# Patient Record
Sex: Female | Born: 1990 | Race: White | Hispanic: No | Marital: Married | Smoking: Never smoker
Health system: Southern US, Community
[De-identification: ages and names within clinical notes are randomized; demographics above are authoritative.]

## PROBLEM LIST (undated history)

## (undated) DIAGNOSIS — E119 Type 2 diabetes mellitus without complications: Secondary | ICD-10-CM

## (undated) DIAGNOSIS — Q512 Other doubling of uterus, unspecified: Secondary | ICD-10-CM

## (undated) DIAGNOSIS — N83209 Unspecified ovarian cyst, unspecified side: Secondary | ICD-10-CM

## (undated) DIAGNOSIS — Q5128 Other doubling of uterus, other specified: Secondary | ICD-10-CM

## (undated) DIAGNOSIS — F4310 Post-traumatic stress disorder, unspecified: Secondary | ICD-10-CM

## (undated) HISTORY — PX: NO PAST SURGERIES: SHX2092

## (undated) HISTORY — DX: Post-traumatic stress disorder, unspecified: F43.10

## (undated) HISTORY — PX: FINGER FRACTURE SURGERY: SHX638

## (undated) HISTORY — DX: Other and unspecified doubling of uterus: Q51.28

## (undated) HISTORY — DX: Other doubling of uterus, unspecified: Q51.20

## (undated) HISTORY — DX: Unspecified ovarian cyst, unspecified side: N83.209

---
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
## 2011-02-26 DIAGNOSIS — E109 Type 1 diabetes mellitus without complications: Secondary | ICD-10-CM | POA: Insufficient documentation

## 2014-03-03 ENCOUNTER — Emergency Department: Payer: Self-pay | Admitting: Emergency Medicine

## 2014-04-04 ENCOUNTER — Ambulatory Visit: Payer: Self-pay | Admitting: Internal Medicine

## 2016-10-25 ENCOUNTER — Ambulatory Visit
Admission: EM | Admit: 2016-10-25 | Discharge: 2016-10-25 | Disposition: A | Discharge status: Home / Self Care | Payer: Federal, State, Local not specified - PPO | Attending: Family Medicine | Admitting: Family Medicine

## 2016-10-25 DIAGNOSIS — Z711 Person with feared health complaint in whom no diagnosis is made: Secondary | ICD-10-CM

## 2016-10-25 HISTORY — DX: Type 2 diabetes mellitus without complications: E11.9

## 2016-10-25 NOTE — ED Triage Notes (Addendum)
Patient was having lab work done yesterday and the tech drawing blood didn't change gloves between patients. [facility] sent her to see a MD yesterday however she would like a second opinion. That MD didn't see any significance in being worried.

## 2016-10-25 NOTE — ED Provider Notes (Signed)
CSN: [id number]     Arrival date & time 10/25/16  1602 History   First MD Initiated Contact with Patient 10/25/16 1619     Chief Complaint  Patient presents with  . Body Fluid Exposure   (Consider location/radiation/quality/duration/timing/severity/associated sxs/prior Treatment) HPI   26 year old Caucasian female with history of insulin-dependent diabetes mellitus that presents with concerns of possible blood borne pathogen transmission. The patient reports that she underwent routine blood work done at a laboratory on 10/24/16. The phlebotomist was moving some specimens with his gloved hands and then proceeded to draw her blood with the same gloves on. The patient is very concerned that she may have been exposed to blood borne pathogens. She denies any dirty needle sticks, cuts or skin abrasions.   The patient was evaluated within the [facility] shortly after this incident for the same. She reports that she was told that there was no indicated testing as the risk of blood-borne pathogen transmission was very low. The patient presents today for a second evaluation. She denies any specific symptoms at this time.  Past Medical History:  Diagnosis Date  . Diabetes mellitus without complication ([place])    History reviewed. No pertinent surgical history. History reviewed. No pertinent family history. Social History  Substance Use Topics  . Smoking status: Never Smoker  . Smokeless tobacco: Never Used  . Alcohol use No   OB History    No data available     Review of Systems  Constitutional: Negative.   HENT: Negative.   Eyes: Negative.   Respiratory: Negative.   Cardiovascular: Negative.   Gastrointestinal: Negative.   Genitourinary: Negative.   Skin: Negative.   Neurological: Negative.     Allergies  Zoloft [sertraline hcl]  Home Medications   Prior to Admission medications   Medication Sig Start Date End Date Taking? Authorizing Provider  insulin aspart (NOVOLOG) 100  UNIT/ML injection Inject into the skin 3 (three) times daily before meals.   Yes Historical Provider, MD   Meds Ordered and Administered this Visit  Medications - No data to display  BP 121/72 (BP Location: Left Arm)   Pulse 99   Temp 98.9 F (37.2 C) (Oral)   Resp 18   Ht 5\' 10"  (1.778 m)   Wt 145 lb (65.8 kg)   SpO2 99%   BMI 20.81 kg/m  No data found.   Physical Exam  Constitutional: She is oriented to person, place, and time. She appears well-developed and well-nourished. No distress.  Cardiovascular: Normal rate, regular rhythm and normal heart sounds.   Pulmonary/Chest: Effort normal and breath sounds normal.  Neurological: She is alert and oriented to person, place, and time.  Skin: Skin is warm and dry.  Psychiatric: She has a normal mood and affect.    Urgent Care Course     Procedures (including critical care time)  Labs Review Labs Reviewed - No data to display  Imaging Review No results found.   Visual Acuity Review  Right Eye Distance:   Left Eye Distance:   Bilateral Distance:    Right Eye Near:   Left Eye Near:    Bilateral Near:         MDM   1. Feared complaint without diagnosis    No indicated testing as the risk of blood-borne pathogen transmission is very low. All questions have been answered and all concerns have been addressed. The patient verbalized understanding and had no further questions     Enrique Sack, FNP 10/25/16  1645  

## 2016-11-29 ENCOUNTER — Ambulatory Visit
Admission: EM | Admit: 2016-11-29 | Discharge: 2016-11-29 | Disposition: A | Discharge status: Home / Self Care | Payer: Federal, State, Local not specified - PPO | Attending: Emergency Medicine | Admitting: Emergency Medicine

## 2016-11-29 DIAGNOSIS — R42 Dizziness and giddiness: Secondary | ICD-10-CM

## 2016-11-29 DIAGNOSIS — I499 Cardiac arrhythmia, unspecified: Secondary | ICD-10-CM | POA: Insufficient documentation

## 2016-11-29 DIAGNOSIS — F418 Other specified anxiety disorders: Secondary | ICD-10-CM | POA: Diagnosis not present

## 2016-11-29 MED ORDER — MECLIZINE HCL 25 MG PO TABS: ORAL_TABLET | ORAL | 0 refills | Status: DC

## 2016-11-29 NOTE — ED Provider Notes (Addendum)
CSN: [id number]     Arrival date & time 11/29/16  1253 History   First MD Initiated Contact with Patient 11/29/16 1401     Chief Complaint  Patient presents with  . Dizziness  . Irregular Heart Beat   (Consider location/radiation/quality/duration/timing/severity/associated sxs/prior Treatment) HPI  This a 26 year old female who presents with dizziness and feelings of an irregular heart rhythm. At work yesterday she said she was unable to stand momentarily. She feels at times that the room will be spinning around her. This happened only on 2 occasions. His had no syncope or near-syncope. She has had no vomiting but has had some associated nausea. She denies any weakness numbness or tingling. 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 She evidently had a drop of  blood hit her exposed skin. There was no penetration. She was seen at the employee health clinic and was told that it is such low risk that no further laboratory or actions needed to be taken. Despite that she is still very worried regarding the exposure and has been ruminating since that time. She states that she has been maintaining her fluids. She is a type I diabetic on insulin pump and states that her sugars have been running 123 this morning.      Past Medical History:  Diagnosis Date  . Diabetes mellitus without complication ([place])    History reviewed. No pertinent surgical history. Family History  Problem Relation Age of Onset  . Diabetes Mother   . Healthy Father    Social History  Substance Use Topics  . Smoking status: Never Smoker  . Smokeless tobacco: Never Used  . Alcohol use No   OB History    No data available     Review of Systems  Constitutional: Positive for activity change. Negative for chills, fatigue and fever.  Cardiovascular: Positive for palpitations.  Negative for chest pain.  Neurological: Positive for dizziness. Negative for syncope, speech difficulty, weakness and numbness.  Psychiatric/Behavioral: Positive for dysphoric mood. The patient is nervous/anxious.   All other systems reviewed and are negative.   Allergies  Zoloft [sertraline hcl]  Home Medications   Prior to Admission medications   Medication Sig Start Date End Date Taking? Authorizing Provider  cetirizine (ZYRTEC) 10 MG tablet Take 10 mg by mouth daily.   Yes [provider]  insulin aspart (NOVOLOG) 100 UNIT/ML injection Inject into the skin 3 (three) times daily before meals.    [provider]  meclizine (ANTIVERT) 25 MG tablet 1/2-1 tablet 3  times daily as necessary for dizziness 11/29/16   Lorin Picket, PA-C   Meds Ordered and Administered this Visit  Medications - No data to display  BP 113/65 (BP Location: Left Arm)   Pulse 75   Temp 98.5 F (36.9 C) (Oral)   Resp 18   Ht 5\' 10"  (1.778 m)   Wt 145 lb (65.8 kg)   LMP 11/26/2016   SpO2 100%   BMI 20.81 kg/m  Orthostatic VS for the past 24 hrs:  BP- Lying Pulse- Lying BP- Sitting Pulse- Sitting BP- Standing at 0 minutes Pulse- Standing at 0 minutes  11/29/16 1338 101/56 70 111/72 79 110/71 92    Physical Exam  Constitutional: She is oriented to person, place, and time. She appears well-developed and well-nourished. No distress.  HENT:  Head: Normocephalic and  atraumatic.  Right Ear: External ear normal.  Left Ear: External ear normal.  Nose: Nose normal.  Mouth/Throat: Oropharynx is clear and moist. No oropharyngeal exudate.  Eyes: EOM are normal. Pupils are equal, round, and reactive to light. Right eye exhibits no discharge. Left eye exhibits no discharge.  Neck: Normal range of motion. Neck supple.  Cardiovascular: Normal rate, regular rhythm and normal heart sounds.  Exam reveals no gallop and no friction rub.   No murmur heard. Musculoskeletal: Normal range of motion.   Lymphadenopathy:    She has no cervical adenopathy.  Neurological: She is alert and oriented to person, place, and time. She displays normal reflexes. No cranial nerve deficit or sensory deficit. She exhibits normal muscle tone. Coordination normal.  Skin: Skin is warm and dry. She is not diaphoretic.  Psychiatric: Her behavior is normal. Judgment and thought content normal.  The patient appears very distant and depressed. She has a rather flat affect.  Nursing note and vitals reviewed.   Urgent Care Course     Procedures (including critical care time)  Labs Review Labs Reviewed - No data to display  Imaging Review No results found.   Visual Acuity Review  Right Eye Distance:   Left Eye Distance:   Bilateral Distance:    Right Eye Near:   Left Eye Near:    Bilateral Near:     ED ECG REPORT   Date: 11/29/2016  EKG Time: 5:52 PM  Rate:71 Rhythm: normal sinus rhythm and sinus arrhythmia, there are no previous tracings available for comparison  Axis: normal  Intervals:none  ST&T Change: None  Narrative Interpretation: Normal sinus rhythm at 71 bpm with sinus arrhythmia. No acute changes seen              MDM   1. Dizziness   2. Anxiety about health    Discharge Medication List as of 11/29/2016  2:29 PM    START taking these medications   Details  meclizine (ANTIVERT) 25 MG tablet 1/2-1 tablet 3  times daily as necessary for dizziness, Normal      Plan: 1. Test/x-ray results and diagnosis reviewed with patient 2. rx as per orders; risks, benefits, potential side effects reviewed with patient 3. Recommend supportive treatment with maintaining intake of fluid and strict glycemic control. Patient's major problem today appears to be her anxiety over the blood exposure that she had in the past. She still ruminating about it and this caused a great deal of anxiety and apparent depression. I have highly recommended that she follow-up with her primary care physician  Dr. Hoy Morn to discuss this further. Another option would be to return to employee health, describe her concerns to see if anything else may be done to alleviate her fears. 4. F/u prn if symptoms worsen or don't improve     Lorin Picket, PA-C 11/29/16 1459    Lorin Picket, PA-C 11/29/16 1752

## 2016-11-29 NOTE — ED Triage Notes (Signed)
Pt c/o being dizzy and feeling like her heartbeat is irregular. Yesterday at work she says she couldn't stand, she is staying hydrated with fluids.

## 2017-02-08 ENCOUNTER — Encounter: Payer: Self-pay | Admitting: Obstetrics and Gynecology

## 2017-02-08 ENCOUNTER — Ambulatory Visit (INDEPENDENT_AMBULATORY_CARE_PROVIDER_SITE_OTHER): Payer: Federal, State, Local not specified - PPO | Admitting: Obstetrics and Gynecology

## 2017-02-08 VITALS — BP 112/66 | HR 86 | Ht 70.0 in | Wt 145.8 lb

## 2017-02-08 DIAGNOSIS — Z9641 Presence of insulin pump (external) (internal): Secondary | ICD-10-CM | POA: Insufficient documentation

## 2017-02-08 DIAGNOSIS — N946 Dysmenorrhea, unspecified: Secondary | ICD-10-CM

## 2017-02-08 DIAGNOSIS — Q5128 Other doubling of uterus, other specified: Secondary | ICD-10-CM

## 2017-02-08 DIAGNOSIS — Z794 Long term (current) use of insulin: Secondary | ICD-10-CM

## 2017-02-08 DIAGNOSIS — Q512 Other doubling of uterus, unspecified: Secondary | ICD-10-CM

## 2017-02-08 DIAGNOSIS — N83202 Unspecified ovarian cyst, left side: Secondary | ICD-10-CM

## 2017-02-08 DIAGNOSIS — E119 Type 2 diabetes mellitus without complications: Secondary | ICD-10-CM

## 2017-02-08 DIAGNOSIS — F419 Anxiety disorder, unspecified: Secondary | ICD-10-CM | POA: Insufficient documentation

## 2017-02-08 DIAGNOSIS — N949 Unspecified condition associated with female genital organs and menstrual cycle: Secondary | ICD-10-CM | POA: Insufficient documentation

## 2017-02-08 DIAGNOSIS — IMO0001 Reserved for inherently not codable concepts without codable children: Secondary | ICD-10-CM

## 2017-02-08 NOTE — Progress Notes (Signed)
Chief complaint: 1. Left lower quadrant pelvic pain 2. History of left ovarian cyst 3. History of septate uterus  Patient is a 26 year old P0, not currently using anything for contraception, not currently sexually active, presents for evaluation of pelvic pain, left lower quadrant, and history of left ovarian cyst.  Gynecologic history: Menarche: Age 45 Age 41-18-intervals where irregular with approximately 3 cycles per year; menses were heavy and painful Age [age] (2010) amenorrhea developed until 2015 when cycles became spontaneous again; evaluation during this timeframe revealed a potential septate uterus and a 6.1 cm left ovarian cyst which remained stable on serial ultrasounds. Last ultrasound was performed in 2012. Age 78 - present-regular spontaneous cycles; interval 30-40 days; duration of flow 4 days  Patient does report significant dysmenorrhea with bilateral pelvic cramping as well as low back ache with radiation into the buttocks and thighs bilaterally. She has never been diagnosed with endometriosis. She has never had a pelvic exam.  Past Medical History:  Diagnosis Date  . Diabetes mellitus without complication ([place])   . Ovarian cyst   . Septate uterus    Past Surgical History:  Procedure Laterality Date  . NO PAST SURGERIES     Family history: Negative for cancer of the colon ovary or breast; diabetes mellitus is notable in mother and brother; heart disease is notable in maternal grandmother  Social history: Nonsmoker; nondrinker; non-substance use  Review of Systems  Constitutional: Negative.   HENT: Negative.   Respiratory: Negative.   Cardiovascular: Negative.   Gastrointestinal: Negative.   Genitourinary: Negative.        Dysmenorrhea  Musculoskeletal: Negative.   Skin: Negative.   Neurological: Negative.   Endo/Heme/Allergies:       Insulin-dependent diabetes mellitus  Psychiatric/Behavioral: Negative.    OBJECTIVE: BP 112/66   Pulse 86   Ht 5\' 10"   (1.778 m)   Wt 145 lb 12.8 oz (66.1 kg)   LMP 02/07/2017 (Exact Date)   BMI 20.92 kg/m  Pleasant well-appearing female in no acute distress. Alert and oriented. Back: No CVA tenderness Abdomen: Soft, nontender without organomegaly Pelvic exam: External genitalia-Normal BUS-normal Vagina-normal estrogen effect; bloody mucus/menses in vault Cervix-bloody mucus at os: Nulliparous; no cervical motion tenderness Uterus-small, mobile, retroverted, nontender Adnexa-nonpalpable and nontender; no obvious fullness or mass in the left adnexa Rectovaginal-normal external exam Extremities: Warm and dry Skin: No rash  ASSESSMENT: 1. History of left ovarian cyst, 6 cm; no clinical abnormality today 2. History of dysmenorrhea 3. History of menorrhagia 4. History of septate uterus  PLAN: 1. Pelvic ultrasound 2. Return in several weeks for follow-up on ultrasound and further management planning 3. Patient needs Pap smear and consideration for contraception 4. Continue Midol for dysmenorrhea  Brayton Mars, MD  Note: This dictation was prepared with Dragon dictation along with smaller phrase technology. Any transcriptional errors that result from this process are unintentional.

## 2017-02-08 NOTE — Patient Instructions (Signed)
1. Pelvic ultrasound is scheduled 2. Return in 1 week after ultrasound (first available appointment) for follow-up 3. Continue taking Midol for dysmenorrhea

## 2017-02-17 ENCOUNTER — Ambulatory Visit
Admission: EM | Admit: 2017-02-17 | Discharge: 2017-02-17 | Disposition: A | Discharge status: Home / Self Care | Payer: Federal, State, Local not specified - PPO | Attending: Registered Nurse | Admitting: Registered Nurse

## 2017-02-17 ENCOUNTER — Encounter: Payer: Self-pay | Admitting: Gynecology

## 2017-02-17 DIAGNOSIS — M545 Low back pain: Secondary | ICD-10-CM

## 2017-02-17 DIAGNOSIS — N390 Urinary tract infection, site not specified: Secondary | ICD-10-CM

## 2017-02-17 LAB — URINALYSIS, COMPLETE (UACMP) WITH MICROSCOPIC
BILIRUBIN URINE: NEGATIVE
Bacteria, UA: NONE SEEN
GLUCOSE, UA: NEGATIVE mg/dL
HGB URINE DIPSTICK: NEGATIVE
Ketones, ur: NEGATIVE mg/dL
Leukocytes, UA: NEGATIVE
NITRITE: NEGATIVE
PH: 7.5 (ref 5.0–8.0)
Protein, ur: NEGATIVE mg/dL
RBC / HPF: NONE SEEN RBC/hpf (ref 0–5)
SPECIFIC GRAVITY, URINE: 1.015 (ref 1.005–1.030)
WBC, UA: NONE SEEN WBC/hpf (ref 0–5)

## 2017-02-17 MED ORDER — SULFAMETHOXAZOLE-TRIMETHOPRIM 800-160 MG PO TABS: 1.0000 | ORAL_TABLET | Freq: Two times a day (BID) | ORAL | 0 refills | Status: DC

## 2017-02-17 NOTE — ED Triage Notes (Signed)
Per patient frequent / burning and lower pelvic pain with urination x 2 weeks.

## 2017-02-17 NOTE — ED Provider Notes (Signed)
CSN: [id number]     Arrival date & time 02/17/17  1223 History   First MD Initiated Contact with Patient 02/17/17 1257     Chief Complaint  Patient presents with  . Urinary Tract Infection   (Consider location/radiation/quality/duration/timing/severity/associated sxs/prior Treatment) 26y/o caucasian female diabetic blood sugar 127 yesterday "normal.  Has been having dysuria (urgency and pressure suprapubic) x 2 weeks denied hematuria, headache, fever, nausea, vomiting, personal or family history kidney stones or antibiotic resistence.  + low back pain bilaterally.  Last UTI March 2018 resolved with antibiotics (she cannot remember what she took)       Past Medical History:  Diagnosis Date  . Diabetes mellitus without complication ([place])   . Ovarian cyst   . Septate uterus    Past Surgical History:  Procedure Laterality Date  . NO PAST SURGERIES     Family History  Problem Relation Age of Onset  . Diabetes Mother   . Healthy Father   . Heart disease Maternal Grandmother   . Breast cancer Neg Hx   . Ovarian cancer Neg Hx   . Colon cancer Neg Hx    Social History  Substance Use Topics  . Smoking status: Never Smoker  . Smokeless tobacco: Never Used  . Alcohol use No   OB History    Gravida Para Term Preterm AB Living   0 0 0 0 0 0   SAB TAB Ectopic Multiple Live Births   0 0 0 0 0     Review of Systems  Constitutional: Negative for activity change, appetite change, chills, diaphoresis, fatigue and fever.  HENT: Negative for trouble swallowing and voice change.   Eyes: Negative for pain and discharge.  Respiratory: Negative for cough and wheezing.   Cardiovascular: Negative for chest pain and leg swelling.  Gastrointestinal: Negative for abdominal distention, abdominal pain, blood in stool, constipation, diarrhea, nausea and vomiting.  Endocrine: Negative for cold intolerance and heat intolerance.  Genitourinary: Positive for dysuria, frequency and urgency. Negative  for decreased urine volume, difficulty urinating, enuresis, flank pain, genital sores, hematuria, menstrual problem, pelvic pain, vaginal bleeding, vaginal discharge and vaginal pain.  Musculoskeletal: Positive for back pain. Negative for arthralgias, gait problem, joint swelling, myalgias, neck pain and neck stiffness.  Skin: Negative for color change and rash.  Allergic/Immunologic: Negative for environmental allergies and food allergies.  Neurological: Negative for dizziness, tremors, syncope, weakness and headaches.  Hematological: Negative for adenopathy. Does not bruise/bleed easily.  Psychiatric/Behavioral: Negative for agitation, confusion and sleep disturbance. The patient is not nervous/anxious.     Allergies  Zoloft [sertraline hcl]  Home Medications   Prior to Admission medications   Medication Sig Start Date End Date Taking? Authorizing Provider  cetirizine (ZYRTEC) 10 MG tablet Take 10 mg by mouth daily.   Yes [provider]  insulin aspart (NOVOLOG) 100 UNIT/ML injection Inject into the skin 3 (three) times daily before meals.   Yes [provider]  sulfamethoxazole-trimethoprim (BACTRIM DS,SEPTRA DS) 800-160 MG tablet Take 1 tablet by mouth 2 (two) times daily. 02/17/17   Betancourt, Aura Fey, NP   Meds Ordered and Administered this Visit  Medications - No data to display  BP 107/68 (BP Location: Left Arm)   Pulse 75   Temp 99 F (37.2 C) (Oral)   Resp 16   Wt 150 lb (68 kg)   LMP 02/07/2017 (Exact Date)   SpO2 100%   BMI 21.52 kg/m  No data found.   Physical Exam  Constitutional: She is oriented to person, place, and time. Vital signs are normal. She appears well-developed and well-nourished. She is active and cooperative.  Non-toxic appearance. She does not have a sickly appearance. She does not appear ill. No distress.  HENT:  Head: Normocephalic and atraumatic.  Right Ear: Hearing and external ear normal.  Left Ear: Hearing and external ear  normal.  Nose: Nose normal.  Mouth/Throat: Uvula is midline, oropharynx is clear and moist and mucous membranes are normal. No oropharyngeal exudate, posterior oropharyngeal edema or posterior oropharyngeal erythema. No tonsillar exudate.  Eyes: Pupils are equal, round, and reactive to light. Conjunctivae, EOM and lids are normal. Right eye exhibits no discharge. Left eye exhibits no discharge. No scleral icterus.  Neck: Trachea normal, normal range of motion and phonation normal. Neck supple. No spinous process tenderness and no muscular tenderness present. No neck rigidity. No tracheal deviation, no edema, no erythema and normal range of motion present. No thyromegaly present.  Cardiovascular: Normal rate, regular rhythm, normal heart sounds and intact distal pulses.  Exam reveals no gallop and no friction rub.   No murmur heard. Pulses:      Radial pulses are 2+ on the right side, and 2+ on the left side.  Pulmonary/Chest: Effort normal and breath sounds normal. No stridor. No respiratory distress. She has no wheezes.  Abdominal: Soft. Normal appearance and bowel sounds are normal. She exhibits no shifting dullness, no distension, no pulsatile liver, no fluid wave, no abdominal bruit, no ascites, no pulsatile midline mass and no mass. There is no hepatosplenomegaly. There is tenderness in the suprapubic area. There is no rigidity, no rebound, no guarding, no CVA tenderness, no tenderness at McBurney's point and negative Murphy's sign. No hernia. Hernia confirmed negative in the ventral area.    Pressure with palpation suprapubic; normoactive bowel sounds; dull to percussion x 4 quads  Musculoskeletal: Normal range of motion. She exhibits no edema, tenderness or deformity.       Right shoulder: Normal.       Left shoulder: Normal.       Right elbow: Normal.      Left elbow: Normal.       Right hip: Normal.       Left hip: Normal.       Right knee: Normal.       Left knee: Normal.       Right  ankle: Normal.       Left ankle: Normal.       Cervical back: Normal.       Thoracic back: Normal.       Lumbar back: She exhibits pain. She exhibits normal range of motion, no tenderness, no bony tenderness, no swelling, no edema, no deformity, no laceration, no spasm and normal pulse.       Back:       Right hand: Normal.       Left hand: Normal.  Lymphadenopathy:       Head (right side): No submental, no submandibular, no tonsillar, no preauricular, no posterior auricular and no occipital adenopathy present.       Head (left side): No submental, no submandibular, no tonsillar, no preauricular, no posterior auricular and no occipital adenopathy present.    She has no cervical adenopathy.       Right cervical: No superficial cervical, no deep cervical and no posterior cervical adenopathy present.      Left cervical: No superficial cervical, no deep cervical and no posterior cervical adenopathy  present.  Neurological: She is alert and oriented to person, place, and time. She has normal strength. She is not disoriented. She displays no atrophy and no tremor. No cranial nerve deficit or sensory deficit. She exhibits normal muscle tone. She displays no seizure activity. Coordination and gait normal. GCS eye subscore is 4. GCS verbal subscore is 5. GCS motor subscore is 6.  Normal/heel toe gait  Skin: Skin is warm, dry and intact. Capillary refill takes less than 2 seconds. No bruising and no rash noted. She is not diaphoretic. No cyanosis or erythema. No pallor. Nails show no clubbing.  Psychiatric: She has a normal mood and affect. Her speech is normal and behavior is normal. Judgment and thought content normal. She is not actively hallucinating. Cognition and memory are normal. She is attentive.  Nursing note and vitals reviewed.   Urgent Care Course     Procedures (including critical care time)  Labs Review Labs Reviewed  URINALYSIS, COMPLETE (UACMP) WITH MICROSCOPIC - Abnormal; Notable  for the following:       Result Value   Color, Urine STRAW (*)    Squamous Epithelial / LPF 0-5 (*)    All other components within normal limits  URINE CULTURE    Imaging Review No results found.  Patient verbally notified of urinalysis results no glucose, ketones, frank blood, bacteria.  Trace epithelials noted.  Sent for urine culture results typically available in 72 hours will call with results once available.  May leave message at [phone] if no answer per patient.  Patient notified can be with urethritis, kidney stones.  Hydrate keep urine pale yellow.  May use OTC azo prn dysuria.  Given bactrim DS po BID x 7days #14 RF0 in case positive culture.  Patient verbalized understanding information/instructions, agreed with plan of care and had no further questions at this time     MDM   1. Urinary tract infection without hematuria, site unspecified    Possible dilution urine as not first and has been hydrating to make sure she could urinate at urgent care.  PMHx diabetes type I states sugars normal 127 last check yesterday.  No glucosuria today.  Bilateral low back pain.  Discussed signs and symptoms nephrolithiasis.  Given Rx Bactrim DS po BID x 7 days #14 RF0 and urine culture pending/sent. Medications as directed.  Patient is also to push fluids and may use Azo OTC po TID as needed.  Hydrate, avoid dehydration.  Avoid holding urine void on frequent basis every 4 to 6 hours.  If unable to void every 8 hours, tea/cola colored urine, frank hematuria, worst back pain of her life, repetitive vomiting follow up for re-evaluation with PCM, urgent care or ER.   Call or return to clinic as needed if these symptoms worsen or fail to improve as anticipated.  Exitcare handout on cystitis given to patient Patient verbalized agreement and understanding of treatment plan and had no further questions at this time. P2:  Hydrate and cranberry juice     Olen Cordial, NP 02/17/17 [phone]

## 2017-02-18 ENCOUNTER — Ambulatory Visit (INDEPENDENT_AMBULATORY_CARE_PROVIDER_SITE_OTHER): Payer: Federal, State, Local not specified - PPO

## 2017-02-18 DIAGNOSIS — Q512 Other doubling of uterus, unspecified: Secondary | ICD-10-CM

## 2017-02-18 DIAGNOSIS — N83202 Unspecified ovarian cyst, left side: Secondary | ICD-10-CM

## 2017-02-18 DIAGNOSIS — Q5128 Other doubling of uterus, other specified: Secondary | ICD-10-CM

## 2017-02-18 LAB — URINE CULTURE: Special Requests: NORMAL

## 2017-02-21 ENCOUNTER — Telehealth: Payer: Self-pay | Admitting: Registered Nurse

## 2017-02-21 ENCOUNTER — Encounter: Payer: Self-pay | Admitting: Registered Nurse

## 2017-02-21 MED ORDER — AMOXICILLIN-POT CLAVULANATE 875-125 MG PO TABS: 1.0000 | ORAL_TABLET | Freq: Two times a day (BID) | ORAL | 0 refills | Status: AC

## 2017-02-21 NOTE — Telephone Encounter (Signed)
Patient contacted via telephone still symptomatic.  Will Rx augmentin 875mg  po BID x 3 days #6 RF0 for treatment of group B strep UTI.  Patient has been taking bactrim instructed her to stop as no improvement with 48 hours of use.  Discussed could be colonized with group B strep and not an acute infection also.  Hydrate avoid holding urine.  If still symptomatic after augmentin will require repeat urinalysis/re-evaluation.  Patient verbalized understanding information/instructions, agreed with plan of care and had no further questions at this time

## 2017-03-13 ENCOUNTER — Encounter: Payer: Federal, State, Local not specified - PPO | Admitting: Obstetrics and Gynecology

## 2017-03-19 ENCOUNTER — Ambulatory Visit (INDEPENDENT_AMBULATORY_CARE_PROVIDER_SITE_OTHER): Payer: Federal, State, Local not specified - PPO | Admitting: Obstetrics and Gynecology

## 2017-03-19 ENCOUNTER — Encounter: Payer: Self-pay | Admitting: Obstetrics and Gynecology

## 2017-03-19 VITALS — BP 89/56 | HR 56 | Ht 70.0 in | Wt 146.2 lb

## 2017-03-19 DIAGNOSIS — N946 Dysmenorrhea, unspecified: Secondary | ICD-10-CM | POA: Diagnosis not present

## 2017-03-19 DIAGNOSIS — N83202 Unspecified ovarian cyst, left side: Secondary | ICD-10-CM

## 2017-03-19 NOTE — Patient Instructions (Signed)
1. Laparoscopy with peritoneal biopsies and left ovarian cystectomy is to be scheduled. 2. Return the week before surgery for preoperative appointment as scheduled  Diagnostic Laparoscopy A diagnostic laparoscopy is a procedure to diagnose diseases in the abdomen. During the procedure, a thin, lighted, pencil-sized instrument called a laparoscope is inserted into the abdomen through an incision. The laparoscope allows your health care provider to look at the organs inside your body. Tell a health care provider about:  Any allergies you have.  All medicines you are taking, including vitamins, herbs, eye drops, creams, and over-the-counter medicines.  Any problems you or family members have had with anesthetic medicines.  Any blood disorders you have.  Any surgeries you have had.  Any medical conditions you have. What are the risks? Generally, this is a safe procedure. However, problems can occur, which may include:  Infection.  Bleeding.  Damage to other organs.  Allergic reaction to the anesthetics used during the procedure.  What happens before the procedure?  Do not eat or drink anything after midnight on the night before the procedure or as directed by your health care provider.  Ask your health care provider about: ? Changing or stopping your regular medicines. ? Taking medicines such as aspirin and ibuprofen. These medicines can thin your blood. Do not take these medicines before your procedure if your health care provider instructs you not to.  Plan to have someone take you home after the procedure. What happens during the procedure?  You may be given a medicine to help you relax (sedative).  You will be given a medicine to make you sleep (general anesthetic).  Your abdomen will be inflated with a gas. This will make your organs easier to see.  Small incisions will be made in your abdomen.  A laparoscope and other small instruments will be inserted into the  abdomen through the incisions.  A tissue sample may be removed from an organ in the abdomen for examination.  The instruments will be removed from the abdomen.  The gas will be released.  The incisions will be closed with stitches (sutures). What happens after the procedure? Your blood pressure, heart rate, breathing rate, and blood oxygen level will be monitored often until the medicines you were given have worn off. This information is not intended to replace advice given to you by your health care provider. Make sure you discuss any questions you have with your health care provider. Document Released: 10/15/2000 Document Revised: 11/17/2015 Document Reviewed: 02/19/2014 Elsevier Interactive Patient Education  [employer].

## 2017-03-19 NOTE — Progress Notes (Signed)
Chief complaint: 1. Follow-up on ultrasound 2. History of dysmenorrhea 3. History of septate uterus  The patient presents today for a conference to discuss ultrasound findings.  Ultrasound: ULTRASOUND REPORT  Location: [facility] Date of Service: 02/18/17   Indications:Pelvic Pain Findings:  The uterus measures 7.1 x 4.1 x 5.2 cm. Echo texture is homogenous without evidence of focal masses. There is fluid seen within the cervix.  The Endometrium measures 6.3 mm.  Right Ovary measures 3.3 x 1.6 x 2.1 cm. It is normal in appearance. Left Ovary measures 9.5 x 6 x 5.3 cm. There is a large simple cyst measuring 5.7 x 3.8 x 6.2 cm. Blood flow is sen in the left ovary using color doppler. Survey of the adnexa demonstrates no adnexal masses. There is no free fluid in the cul de sac.  Impression: 1. There is a large simple cyst of the left measuring 5.7 x 3.8 x 6.2 cm. 2. Fluid seen in cervix  Recommendations: 1.Clinical correlation with the patient's History and Physical Exam.   Tyrone Apple  Brayton Mars, MD  Findings from ultrasound are reviewed.  OBJECTIVE: BP (!) 89/56   Pulse (!) 56   Ht 5\' 10"  (1.778 m)   Wt 146 lb 3.2 oz (66.3 kg)   LMP 03/08/2017 (Exact Date)   BMI 20.98 kg/m  Physical exam-deferred  ASSESSMENT: 1. Pelvic pain 2. History of dysmenorrhea 3. Left ovarian cyst, persistent, 6.2 cm 4. Insulin dependent diabetes mellitus  PLAN: 1. Recommend laparoscopy with peritoneal biopsies to rule out endometriosis as well as left ovarian cystectomy 2. Consider oral contraceptives or Depo-Provera for management of dysmenorrhea and menorrhagia 3. Return 1 week prior to surgery for preoperative appointment 4. Literature regarding laparoscopy is provided  A total of 15 minutes were spent face-to-face with the patient during this encounter and over half of that time dealt with counseling and coordination of care.  Brayton Mars, MD  Note: This dictation was prepared with Dragon dictation along with smaller phrase technology. Any transcriptional errors that result from this process are unintentional.

## 2017-05-07 ENCOUNTER — Encounter: Payer: Self-pay | Admitting: Obstetrics and Gynecology

## 2017-05-07 ENCOUNTER — Ambulatory Visit (INDEPENDENT_AMBULATORY_CARE_PROVIDER_SITE_OTHER): Payer: Federal, State, Local not specified - PPO | Admitting: Obstetrics and Gynecology

## 2017-05-07 VITALS — BP 103/69 | HR 80 | Ht 70.0 in | Wt 143.2 lb

## 2017-05-07 DIAGNOSIS — Q512 Other doubling of uterus, unspecified: Secondary | ICD-10-CM

## 2017-05-07 DIAGNOSIS — Z01818 Encounter for other preprocedural examination: Secondary | ICD-10-CM

## 2017-05-07 DIAGNOSIS — N946 Dysmenorrhea, unspecified: Secondary | ICD-10-CM

## 2017-05-07 DIAGNOSIS — N83202 Unspecified ovarian cyst, left side: Secondary | ICD-10-CM

## 2017-05-07 DIAGNOSIS — Q5128 Other doubling of uterus, other specified: Secondary | ICD-10-CM

## 2017-05-07 DIAGNOSIS — E119 Type 2 diabetes mellitus without complications: Secondary | ICD-10-CM

## 2017-05-07 DIAGNOSIS — Z794 Long term (current) use of insulin: Secondary | ICD-10-CM

## 2017-05-07 DIAGNOSIS — IMO0001 Reserved for inherently not codable concepts without codable children: Secondary | ICD-10-CM

## 2017-05-07 NOTE — H&P (Signed)
Subjective:  PREOPERATIVE HISTORY AND PHYSICAL   Date of surgery: 05/13/2017 Procedure: Laparoscopy with peritoneal biopsies and left ovarian cystectomy Diagnoses: 1. Pelvic pain 2. Dysmenorrhea 3. Left ovarian cyst   Patient is a 26 y.o. [id number] scheduled for surgery on 05/13/2017.  Patient has history of left lower quadrant pain and left ovarian cyst along with possible septate uterus. Due to the degree of painful symptoms, she desires workup including probable left ovarian cystectomy and peritoneal biopsies for rule out endometriosis.   Gynecologic history: Menarche: Age 89 Age [age]-intervals where irregular with approximately 3 cycles per year; menses were heavy and painful Age [age] (2010) amenorrhea developed until 2015 when cycles became spontaneous again; evaluation during this timeframe revealed a potential septate uterus and a 6.1 cm left ovarian cyst which remained stable on serial ultrasounds. Last ultrasound was performed in 2012. Age 17 - present-regular spontaneous cycles; interval 30-40 days; duration of flow 4 days  Patient does report significant dysmenorrhea with bilateral pelvic cramping as well as low back ache with radiation into the buttocks and thighs bilaterally. She has never been diagnosed with endometriosis. She has never had a pelvic exam.  OB History    Gravida Para Term Preterm AB Living   0 0 0 0 0 0   SAB TAB Ectopic Multiple Live Births   0 0 0 0 0      Patient's last menstrual period was 04/06/2017 (exact date).    Past Medical History:  Diagnosis Date  . Diabetes mellitus without complication ([place])   . Ovarian cyst   . PTSD (post-traumatic stress disorder)   . Septate uterus     Past Surgical History:  Procedure Laterality Date  . NO PAST SURGERIES      OB History  Gravida Para Term Preterm AB Living  0 0 0 0 0 0  SAB TAB Ectopic Multiple Live Births  0 0 0 0 0        Social History   Social History  . Marital  status: Single    Spouse name: N/A  . Number of children: N/A  . Years of education: N/A   Social History Main Topics  . Smoking status: Never Smoker  . Smokeless tobacco: Never Used  . Alcohol use No  . Drug use: No  . Sexual activity: Yes    Birth control/ protection: Condom   Other Topics Concern  . None   Social History Narrative  . None    Family History  Problem Relation Age of Onset  . Diabetes Mother   . Healthy Father   . Heart disease Maternal Grandmother   . Breast cancer Neg Hx   . Ovarian cancer Neg Hx   . Colon cancer Neg Hx      (Not in a hospital admission)  Allergies  Allergen Reactions  . Zoloft [Sertraline Hcl] Itching    Review of Systems Constitutional: No recent fever/chills/sweats Respiratory: No recent cough/bronchitis Cardiovascular: No chest pain Gastrointestinal: No recent nausea/vomiting/diarrhea Genitourinary: No UTI symptoms Hematologic/lymphatic:No history of coagulopathy or recent blood thinner use    Objective:    BP 103/69   Pulse 80   Ht 5\' 10"  (1.778 m)   Wt 143 lb 3.2 oz (65 kg)   LMP 04/06/2017 (Exact Date)   BMI 20.55 kg/m   General:   Normal  Skin:   normal  HEENT:  Normal  Neck:  Supple without Adenopathy or Thyromegaly  Lungs:   Heart:  Breasts:   Abdomen:  Pelvis:  M/S   Extremeties:  Neuro:    clear to auscultation bilaterally   Normal without murmur   Not Examined   soft, non-tender; bowel sounds normal; no masses,  no organomegaly   Exam deferred to OR  No CVAT  Warm/Dry   Normal       02/08/2017 Pelvic exam: External genitalia-Normal BUS-normal Vagina-normal estrogen effect; bloody mucus/menses in vault Cervix-bloody mucus at os: Nulliparous; no cervical motion tenderness Uterus-small, mobile, retroverted, nontender Adnexa-nonpalpable and nontender; no obvious fullness or mass in the left adnexa Rectovaginal-normal external exam  02/18/2017 ULTRASOUND  REPORT  Location: [facility] Date of Service: 02/18/17   Indications:Pelvic Pain Findings: The uterus measures 7.1 x 4.1 x 5.2 cm. Echo texture is homogenouswithoutevidence of focal masses. There is fluid seen within the cervix.  The Endometriummeasures 6.3 mm.  Right Ovary measures 3.3 x 1.6 x 2.1 cm. It is normal in appearance. Left Ovary measures 9.5 x 6 x 5.3 cm. There is a large simple cyst measuring 5.7 x 3.8 x 6.2 cm. Blood flow is sen in the left ovary using color doppler. Survey of the adnexa demonstrates no adnexal masses. There is no free fluid in the cul de sac.  Impression: 1. There is a large simple cyst of the left measuring 5.7 x 3.8 x 6.2 cm. 2. Fluid seen in cervix   Assessment:     1. Left ovarian cyst, simple, measuring 6.2 cm in greatest diameter 2. History of dysmenorrhea   Plan:  Laparoscopy with peritoneal biopsies and left ovarian cystectomy  Preoperative counseling: The patient is to undergo laparoscopy with peritoneal biopsies and left ovarian cystectomy on 05/13/2017. She is understanding of the planned procedures and is aware of and is accepting of all surgical risks which include but are not limited to bleeding, infection, pelvic organ injury with a 4 prep, blood clot disorders, anesthesia risks, etc. All questions have been answered. Informed consent is given. Patient is ready and willing to proceed with surgery as scheduled.  Brayton Mars, MD  Note: This dictation was prepared with Dragon dictation along with smaller phrase technology. Any transcriptional errors that result from this process are unintentional.

## 2017-05-07 NOTE — Patient Instructions (Signed)
1.  Return for postop check 1 week after surgery 

## 2017-05-07 NOTE — Progress Notes (Signed)
Subjective:  PREOPERATIVE HISTORY AND PHYSICAL   Date of surgery: 05/13/2017 Procedure: Laparoscopy with peritoneal biopsies and left ovarian cystectomy Diagnoses: 1. Pelvic pain 2. Dysmenorrhea 3. Left ovarian cyst   Patient is a 26 y.o. [id number] scheduled for surgery on 05/13/2017.  Patient has history of left lower quadrant pain and left ovarian cyst along with possible septate uterus. Due to the degree of painful symptoms, she desires workup including probable left ovarian cystectomy and peritoneal biopsies for rule out endometriosis.   Gynecologic history: Menarche: Age 44 Age 12-18-intervals where irregular with approximately 3 cycles per year; menses were heavy and painful Age [age] (2010) amenorrhea developed until 2015 when cycles became spontaneous again; evaluation during this timeframe revealed a potential septate uterus and a 6.1 cm left ovarian cyst which remained stable on serial ultrasounds. Last ultrasound was performed in 2012. Age 40 - present-regular spontaneous cycles; interval 30-40 days; duration of flow 4 days  Patient does report significant dysmenorrhea with bilateral pelvic cramping as well as low back ache with radiation into the buttocks and thighs bilaterally. She has never been diagnosed with endometriosis. She has never had a pelvic exam.  OB History    Gravida Para Term Preterm AB Living   0 0 0 0 0 0   SAB TAB Ectopic Multiple Live Births   0 0 0 0 0      Patient's last menstrual period was 04/06/2017 (exact date).    Past Medical History:  Diagnosis Date  . Diabetes mellitus without complication ([place])   . Ovarian cyst   . PTSD (post-traumatic stress disorder)   . Septate uterus     Past Surgical History:  Procedure Laterality Date  . NO PAST SURGERIES      OB History  Gravida Para Term Preterm AB Living  0 0 0 0 0 0  SAB TAB Ectopic Multiple Live Births  0 0 0 0 0        Social History   Social History  . Marital  status: Single    Spouse name: N/A  . Number of children: N/A  . Years of education: N/A   Social History Main Topics  . Smoking status: Never Smoker  . Smokeless tobacco: Never Used  . Alcohol use No  . Drug use: No  . Sexual activity: Yes    Birth control/ protection: Condom   Other Topics Concern  . None   Social History Narrative  . None    Family History  Problem Relation Age of Onset  . Diabetes Mother   . Healthy Father   . Heart disease Maternal Grandmother   . Breast cancer Neg Hx   . Ovarian cancer Neg Hx   . Colon cancer Neg Hx      (Not in a hospital admission)  Allergies  Allergen Reactions  . Zoloft [Sertraline Hcl] Itching    Review of Systems Constitutional: No recent fever/chills/sweats Respiratory: No recent cough/bronchitis Cardiovascular: No chest pain Gastrointestinal: No recent nausea/vomiting/diarrhea Genitourinary: No UTI symptoms Hematologic/lymphatic:No history of coagulopathy or recent blood thinner use    Objective:    BP 103/69   Pulse 80   Ht 5\' 10"  (1.778 m)   Wt 143 lb 3.2 oz (65 kg)   LMP 04/06/2017 (Exact Date)   BMI 20.55 kg/m   General:   Normal  Skin:   normal  HEENT:  Normal  Neck:  Supple without Adenopathy or Thyromegaly  Lungs:   Heart:  Breasts:   Abdomen:  Pelvis:  M/S   Extremeties:  Neuro:    clear to auscultation bilaterally   Normal without murmur   Not Examined   soft, non-tender; bowel sounds normal; no masses,  no organomegaly   Exam deferred to OR  No CVAT  Warm/Dry   Normal       02/08/2017 Pelvic exam: External genitalia-Normal BUS-normal Vagina-normal estrogen effect; bloody mucus/menses in vault Cervix-bloody mucus at os: Nulliparous; no cervical motion tenderness Uterus-small, mobile, retroverted, nontender Adnexa-nonpalpable and nontender; no obvious fullness or mass in the left adnexa Rectovaginal-normal external exam  02/18/2017 ULTRASOUND  REPORT  Location: [facility] Date of Service: 02/18/17   Indications:Pelvic Pain Findings: The uterus measures 7.1 x 4.1 x 5.2 cm. Echo texture is homogenouswithoutevidence of focal masses. There is fluid seen within the cervix.  The Endometriummeasures 6.3 mm.  Right Ovary measures 3.3 x 1.6 x 2.1 cm. It is normal in appearance. Left Ovary measures 9.5 x 6 x 5.3 cm. There is a large simple cyst measuring 5.7 x 3.8 x 6.2 cm. Blood flow is sen in the left ovary using color doppler. Survey of the adnexa demonstrates no adnexal masses. There is no free fluid in the cul de sac.  Impression: 1. There is a large simple cyst of the left measuring 5.7 x 3.8 x 6.2 cm. 2. Fluid seen in cervix   Assessment:     1. Left ovarian cyst, simple, measuring 6.2 cm in greatest diameter 2. History of dysmenorrhea   Plan:  Laparoscopy with peritoneal biopsies and left ovarian cystectomy  Preoperative counseling: The patient is to undergo laparoscopy with peritoneal biopsies and left ovarian cystectomy on 05/13/2017. She is understanding of the planned procedures and is aware of and is accepting of all surgical risks which include but are not limited to bleeding, infection, pelvic organ injury with a 4 prep, blood clot disorders, anesthesia risks, etc. All questions have been answered. Informed consent is given. Patient is ready and willing to proceed with surgery as scheduled.  Brayton Mars, MD  Note: This dictation was prepared with Dragon dictation along with smaller phrase technology. Any transcriptional errors that result from this process are unintentional.

## 2017-05-08 ENCOUNTER — Other Ambulatory Visit: Payer: Federal, State, Local not specified - PPO

## 2017-05-10 ENCOUNTER — Encounter
Admission: RE | Admit: 2017-05-10 | Discharge: 2017-05-10 | Disposition: A | Discharge status: Home / Self Care | Payer: Federal, State, Local not specified - PPO | Source: Ambulatory Visit | Attending: Obstetrics and Gynecology | Admitting: Obstetrics and Gynecology

## 2017-05-10 DIAGNOSIS — N946 Dysmenorrhea, unspecified: Secondary | ICD-10-CM | POA: Diagnosis not present

## 2017-05-10 DIAGNOSIS — D271 Benign neoplasm of left ovary: Secondary | ICD-10-CM | POA: Diagnosis not present

## 2017-05-10 DIAGNOSIS — F4310 Post-traumatic stress disorder, unspecified: Secondary | ICD-10-CM | POA: Diagnosis not present

## 2017-05-10 DIAGNOSIS — N83202 Unspecified ovarian cyst, left side: Secondary | ICD-10-CM | POA: Diagnosis present

## 2017-05-10 DIAGNOSIS — E119 Type 2 diabetes mellitus without complications: Secondary | ICD-10-CM | POA: Diagnosis not present

## 2017-05-10 DIAGNOSIS — Z794 Long term (current) use of insulin: Secondary | ICD-10-CM | POA: Diagnosis not present

## 2017-05-10 DIAGNOSIS — N803 Endometriosis of pelvic peritoneum: Secondary | ICD-10-CM | POA: Diagnosis not present

## 2017-05-10 LAB — CBC WITH DIFFERENTIAL/PLATELET
Basophils Absolute: 0 10*3/uL (ref 0–0.1)
Basophils Relative: 0 %
EOS ABS: 0 10*3/uL (ref 0–0.7)
EOS PCT: 0 %
HCT: 42.3 % (ref 35.0–47.0)
Hemoglobin: 14.5 g/dL (ref 12.0–16.0)
LYMPHS ABS: 0.5 10*3/uL — AB (ref 1.0–3.6)
LYMPHS PCT: 5 %
MCH: 30.3 pg (ref 26.0–34.0)
MCHC: 34.2 g/dL (ref 32.0–36.0)
MCV: 88.4 fL (ref 80.0–100.0)
MONO ABS: 0.4 10*3/uL (ref 0.2–0.9)
MONOS PCT: 5 %
Neutro Abs: 7.9 10*3/uL — ABNORMAL HIGH (ref 1.4–6.5)
Neutrophils Relative %: 90 %
PLATELETS: 163 10*3/uL (ref 150–440)
RBC: 4.78 MIL/uL (ref 3.80–5.20)
RDW: 13.4 % (ref 11.5–14.5)
WBC: 8.8 10*3/uL (ref 3.6–11.0)

## 2017-05-10 LAB — TYPE AND SCREEN
ABO/RH(D): O POS
Antibody Screen: NEGATIVE

## 2017-05-10 LAB — RAPID HIV SCREEN (HIV 1/2 AB+AG)
HIV 1/2 Antibodies: NONREACTIVE
HIV-1 P24 ANTIGEN - HIV24: NONREACTIVE

## 2017-05-10 NOTE — Patient Instructions (Signed)
Your procedure is scheduled on: Monday 05/13/17 Report to [facility]. 2ND FLOOR MEDICAL MALL ENTRANCE. To find out your arrival time please call [phone] between 1PM - 3PM on Today.  Remember: Instructions that are not followed completely may result in serious medical risk, up to and including death, or upon the discretion of your surgeon and anesthesiologist your surgery may need to be rescheduled.    __X__ 1. Do not eat anything after midnight the night before your    procedure.  No gum chewing or hard candies.  You may drink clear   liquids up to 2 hours before you are scheduled to arrive at the   hospital for your procedure. Do not drink clear liquids within 2   hours of scheduled arrival to the hospital as this may lead to your   procedure being delayed or rescheduled.       Clear liquids include:   Water or Apple juice without pulp   Clear carbohydrate beverage such as Clearfast or Gatorade   Black coffee or Clear Tea (no milk, no creamer, do not add anything   to the coffee or tea)    Diabetics should only drink water   __X__ 2. No Alcohol for 24 hours before or after surgery.   ____ 3. Bring all medications with you on the day of surgery if instructed.    __X__ 4. Notify your doctor if there is any change in your medical condition     (cold, fever, infections).             __X___5. No smoking within 24 hours of your surgery.     Do not wear jewelry, make-up, hairpins, clips or nail polish.  Do not wear lotions, powders, or perfumes.   Do not shave 48 hours prior to surgery. Men may shave face and neck.  Do not bring valuables to the hospital.    [facility] is not responsible for any belongings or valuables.               Contacts, dentures or bridgework may not be worn into surgery.  Leave your suitcase in the car. After surgery it may be brought to your room.  For patients admitted to the hospital, discharge time is determined by your                treatment  team.   Patients discharged the day of surgery will not be allowed to drive home.   Please read over the following fact sheets that you were given:   MRSA Information   ____ Take these medicines the morning of surgery with A SIP OF WATER:    1. Cariprazine if scheduled day  2. Cetirizine  3. Hydoxyzine if needed  4.  5.  6.  ____ Fleet Enema (as directed)   __X__ Use CHG Soap/SAGE wipes as directed  ____ Use inhalers on the day of surgery  ____ Stop metformin 2 days prior to surgery    ____ Take 1/2 of usual insulin dose the night before surgery and none on the morning of surgery.   ____ Stop Coumadin/Plavix/aspirin on   __X__ Stop Anti-inflammatories such as Advil, Aleve, Ibuprofen, Motrin, Naproxen, Naprosyn, Goodies,powder, or aspirin products.  OK to take Tylenol.   __X__ Stop supplements, Vitamin E, Fish Oil until after surgery.    ____ Bring C-Pap to the hospital.

## 2017-05-11 LAB — RPR: RPR Ser Ql: NONREACTIVE

## 2017-05-13 ENCOUNTER — Encounter: Payer: Self-pay | Admitting: *Deleted

## 2017-05-13 ENCOUNTER — Ambulatory Visit
Admission: RE | Admit: 2017-05-13 | Discharge: 2017-05-13 | Disposition: A | Discharge status: Home / Self Care | Payer: Federal, State, Local not specified - PPO | Source: Ambulatory Visit | Attending: Obstetrics and Gynecology | Admitting: Obstetrics and Gynecology

## 2017-05-13 ENCOUNTER — Ambulatory Visit: Payer: Federal, State, Local not specified - PPO | Admitting: Anesthesiology

## 2017-05-13 ENCOUNTER — Encounter
Admission: RE | Disposition: A | Discharge status: Home / Self Care | Payer: Self-pay | Source: Ambulatory Visit | Attending: Obstetrics and Gynecology

## 2017-05-13 DIAGNOSIS — E119 Type 2 diabetes mellitus without complications: Secondary | ICD-10-CM | POA: Insufficient documentation

## 2017-05-13 DIAGNOSIS — N803 Endometriosis of pelvic peritoneum: Secondary | ICD-10-CM | POA: Insufficient documentation

## 2017-05-13 DIAGNOSIS — Z794 Long term (current) use of insulin: Secondary | ICD-10-CM | POA: Insufficient documentation

## 2017-05-13 DIAGNOSIS — N809 Endometriosis, unspecified: Secondary | ICD-10-CM

## 2017-05-13 DIAGNOSIS — N83202 Unspecified ovarian cyst, left side: Secondary | ICD-10-CM

## 2017-05-13 DIAGNOSIS — Z9889 Other specified postprocedural states: Secondary | ICD-10-CM

## 2017-05-13 DIAGNOSIS — D271 Benign neoplasm of left ovary: Secondary | ICD-10-CM | POA: Diagnosis not present

## 2017-05-13 DIAGNOSIS — N946 Dysmenorrhea, unspecified: Secondary | ICD-10-CM | POA: Insufficient documentation

## 2017-05-13 DIAGNOSIS — F4310 Post-traumatic stress disorder, unspecified: Secondary | ICD-10-CM | POA: Insufficient documentation

## 2017-05-13 HISTORY — PX: LAPAROSCOPIC OVARIAN CYSTECTOMY: SHX6248

## 2017-05-13 HISTORY — PX: FULGURATION OF LESION: SHX6726

## 2017-05-13 LAB — POCT PREGNANCY, URINE: PREG TEST UR: NEGATIVE

## 2017-05-13 LAB — GLUCOSE, CAPILLARY
GLUCOSE-CAPILLARY: 234 mg/dL — AB (ref 65–99)
GLUCOSE-CAPILLARY: 217 mg/dL — AB (ref 65–99)
Glucose-Capillary: 107 mg/dL — ABNORMAL HIGH (ref 65–99)
Glucose-Capillary: 161 mg/dL — ABNORMAL HIGH (ref 65–99)

## 2017-05-13 SURGERY — EXCISION, CYST, OVARY, LAPAROSCOPIC
Anesthesia: General | Site: Abdomen | Wound class: Clean

## 2017-05-13 MED ORDER — LIDOCAINE HCL (CARDIAC) 20 MG/ML IV SOLN
INTRAVENOUS | Status: DC | PRN
  Administered 2017-05-13: 50 mg via INTRAVENOUS

## 2017-05-13 MED ORDER — FENTANYL CITRATE (PF) 100 MCG/2ML IJ SOLN
INTRAMUSCULAR | Status: AC
  Filled 2017-05-13: qty 2

## 2017-05-13 MED ORDER — LACTATED RINGERS IV SOLN: INTRAVENOUS | Status: DC

## 2017-05-13 MED ORDER — MIDAZOLAM HCL 2 MG/2ML IJ SOLN
INTRAMUSCULAR | Status: AC
  Filled 2017-05-13: qty 2

## 2017-05-13 MED ORDER — SODIUM CHLORIDE 0.9 % IV SOLN
INTRAVENOUS | Status: DC
  Administered 2017-05-13: 07:00:00 via INTRAVENOUS

## 2017-05-13 MED ORDER — SUGAMMADEX SODIUM 200 MG/2ML IV SOLN
INTRAVENOUS | Status: AC
  Filled 2017-05-13: qty 2

## 2017-05-13 MED ORDER — FAMOTIDINE 20 MG PO TABS
20.0000 mg | ORAL_TABLET | Freq: Once | ORAL | Status: AC
  Administered 2017-05-13: 20 mg via ORAL

## 2017-05-13 MED ORDER — OXYCODONE-ACETAMINOPHEN 5-325 MG PO TABS
ORAL_TABLET | ORAL | Status: AC
  Filled 2017-05-13: qty 1

## 2017-05-13 MED ORDER — ACETAMINOPHEN 10 MG/ML IV SOLN
INTRAVENOUS | Status: DC | PRN
  Administered 2017-05-13: 1000 mg via INTRAVENOUS

## 2017-05-13 MED ORDER — IBUPROFEN 800 MG PO TABS: 800.0000 mg | ORAL_TABLET | Freq: Three times a day (TID) | ORAL | 0 refills | Status: DC

## 2017-05-13 MED ORDER — ROCURONIUM BROMIDE 50 MG/5ML IV SOLN
INTRAVENOUS | Status: AC
  Filled 2017-05-13: qty 1

## 2017-05-13 MED ORDER — OXYCODONE-ACETAMINOPHEN 5-325 MG PO TABS
1.0000 | ORAL_TABLET | ORAL | Status: DC | PRN
  Administered 2017-05-13: 1 via ORAL

## 2017-05-13 MED ORDER — PROPOFOL 10 MG/ML IV BOLUS
INTRAVENOUS | Status: DC | PRN
  Administered 2017-05-13: 130 mg via INTRAVENOUS

## 2017-05-13 MED ORDER — FENTANYL CITRATE (PF) 100 MCG/2ML IJ SOLN
25.0000 ug | INTRAMUSCULAR | Status: DC | PRN
  Administered 2017-05-13 (×4): 25 ug via INTRAVENOUS

## 2017-05-13 MED ORDER — ACETAMINOPHEN NICU IV SYRINGE 10 MG/ML
INTRAVENOUS | Status: AC
  Filled 2017-05-13: qty 1

## 2017-05-13 MED ORDER — DOCUSATE SODIUM 100 MG PO CAPS: 100.0000 mg | ORAL_CAPSULE | Freq: Two times a day (BID) | ORAL | 0 refills | Status: DC

## 2017-05-13 MED ORDER — ONDANSETRON HCL 4 MG/2ML IJ SOLN: 4.0000 mg | Freq: Once | INTRAMUSCULAR | Status: DC | PRN

## 2017-05-13 MED ORDER — SUGAMMADEX SODIUM 200 MG/2ML IV SOLN
INTRAVENOUS | Status: DC | PRN
  Administered 2017-05-13: 130 mg via INTRAVENOUS

## 2017-05-13 MED ORDER — FENTANYL CITRATE (PF) 100 MCG/2ML IJ SOLN
INTRAMUSCULAR | Status: DC | PRN
  Administered 2017-05-13: 50 ug via INTRAVENOUS
  Administered 2017-05-13: 100 ug via INTRAVENOUS
  Administered 2017-05-13: 50 ug via INTRAVENOUS

## 2017-05-13 MED ORDER — MIDAZOLAM HCL 2 MG/2ML IJ SOLN
INTRAMUSCULAR | Status: DC | PRN
  Administered 2017-05-13: 2 mg via INTRAVENOUS

## 2017-05-13 MED ORDER — FENTANYL CITRATE (PF) 100 MCG/2ML IJ SOLN
INTRAMUSCULAR | Status: AC
  Administered 2017-05-13: 25 ug via INTRAVENOUS
  Filled 2017-05-13: qty 2

## 2017-05-13 MED ORDER — DEXAMETHASONE SODIUM PHOSPHATE 10 MG/ML IJ SOLN
INTRAMUSCULAR | Status: AC
  Filled 2017-05-13: qty 1

## 2017-05-13 MED ORDER — ROCURONIUM BROMIDE 100 MG/10ML IV SOLN
INTRAVENOUS | Status: DC | PRN
  Administered 2017-05-13: 30 mg via INTRAVENOUS
  Administered 2017-05-13: 20 mg via INTRAVENOUS

## 2017-05-13 MED ORDER — OXYCODONE-ACETAMINOPHEN 5-325 MG PO TABS: 1.0000 | ORAL_TABLET | ORAL | 0 refills | Status: DC | PRN

## 2017-05-13 MED ORDER — FAMOTIDINE 20 MG PO TABS
ORAL_TABLET | ORAL | Status: AC
  Filled 2017-05-13: qty 1

## 2017-05-13 MED ORDER — LIDOCAINE HCL (PF) 2 % IJ SOLN
INTRAMUSCULAR | Status: AC
  Filled 2017-05-13: qty 10

## 2017-05-13 MED ORDER — PROPOFOL 10 MG/ML IV BOLUS
INTRAVENOUS | Status: AC
  Filled 2017-05-13: qty 20

## 2017-05-13 MED ORDER — ONDANSETRON HCL 4 MG/2ML IJ SOLN
INTRAMUSCULAR | Status: AC
  Filled 2017-05-13: qty 2

## 2017-05-13 MED ORDER — DEXAMETHASONE SODIUM PHOSPHATE 10 MG/ML IJ SOLN
INTRAMUSCULAR | Status: DC | PRN
  Administered 2017-05-13: 10 mg via INTRAVENOUS

## 2017-05-13 MED ORDER — ONDANSETRON HCL 4 MG/2ML IJ SOLN
INTRAMUSCULAR | Status: DC | PRN
  Administered 2017-05-13: 4 mg via INTRAVENOUS

## 2017-05-13 SURGICAL SUPPLY — 40 items
ANCHOR TIS RET SYS 235ML (MISCELLANEOUS) IMPLANT
BLADE SURG SZ11 CARB STEEL (BLADE) ×4 IMPLANT
CANISTER SUCT 1200ML W/VALVE (MISCELLANEOUS) ×4 IMPLANT
CATH ROBINSON RED A/P 16FR (CATHETERS) ×4 IMPLANT
CHLORAPREP W/TINT 26ML (MISCELLANEOUS) ×4 IMPLANT
DERMABOND ADVANCED (GAUZE/BANDAGES/DRESSINGS) ×2
DERMABOND ADVANCED .7 DNX12 (GAUZE/BANDAGES/DRESSINGS) ×2 IMPLANT
DRSG TEGADERM 2-3/8X2-3/4 SM (GAUZE/BANDAGES/DRESSINGS) ×4 IMPLANT
DRSG TEGADERM 4X4.75 (GAUZE/BANDAGES/DRESSINGS) IMPLANT
DRSG TELFA 3X8 NADH (GAUZE/BANDAGES/DRESSINGS) ×4 IMPLANT
DRSG TELFA 4X3 1S NADH ST (GAUZE/BANDAGES/DRESSINGS) IMPLANT
GLOVE BIO SURGEON STRL SZ8 (GLOVE) ×8 IMPLANT
GLOVE INDICATOR 8.0 STRL GRN (GLOVE) ×8 IMPLANT
GOWN STRL REUS W/ TWL LRG LVL3 (GOWN DISPOSABLE) ×2 IMPLANT
GOWN STRL REUS W/ TWL XL LVL3 (GOWN DISPOSABLE) ×2 IMPLANT
GOWN STRL REUS W/TWL LRG LVL3 (GOWN DISPOSABLE) ×2
GOWN STRL REUS W/TWL XL LVL3 (GOWN DISPOSABLE) ×2
IRRIGATION STRYKERFLOW (MISCELLANEOUS) IMPLANT
IRRIGATOR STRYKERFLOW (MISCELLANEOUS)
IV LACTATED RINGERS 1000ML (IV SOLUTION) ×4 IMPLANT
IV NS 1000ML (IV SOLUTION) ×2
IV NS 1000ML BAXH (IV SOLUTION) ×2 IMPLANT
KIT PINK PAD W/HEAD ARE REST (MISCELLANEOUS) ×4
KIT PINK PAD W/HEAD ARM REST (MISCELLANEOUS) ×2 IMPLANT
KIT RM TURNOVER CYSTO AR (KITS) ×4 IMPLANT
LABEL OR SOLS (LABEL) IMPLANT
NS IRRIG 500ML POUR BTL (IV SOLUTION) ×4 IMPLANT
PACK GYN LAPAROSCOPIC (MISCELLANEOUS) ×4 IMPLANT
PAD OB MATERNITY 4.3X12.25 (PERSONAL CARE ITEMS) ×4 IMPLANT
PAD PREP 24X41 OB/GYN DISP (PERSONAL CARE ITEMS) ×4 IMPLANT
PAD TELFA 2X3 NADH STRL (GAUZE/BANDAGES/DRESSINGS) ×4 IMPLANT
SCISSORS METZENBAUM CVD 33 (INSTRUMENTS) IMPLANT
SLEEVE ENDOPATH XCEL 5M (ENDOMECHANICALS) ×4 IMPLANT
SUT MNCRL 4-0 (SUTURE) ×2
SUT MNCRL 4-0 27XMFL (SUTURE) ×2
SUT VIC AB 0 UR5 27 (SUTURE) ×4 IMPLANT
SUTURE MNCRL 4-0 27XMF (SUTURE) ×2 IMPLANT
SYR 30ML LL (SYRINGE) ×4 IMPLANT
TROCAR XCEL NON-BLD 5MMX100MML (ENDOMECHANICALS) ×4 IMPLANT
TUBING INSUFFLATOR HI FLOW (MISCELLANEOUS) ×4 IMPLANT

## 2017-05-13 NOTE — Anesthesia Postprocedure Evaluation (Signed)
Anesthesia Post Note  Patient: Michelle [name]  Procedure(s) Performed: LAPAROSCOPIC LEFT OVARIAN CYSTECTOMY WITH PERITONEAL BIOPSIE (Left Abdomen) FULGURATION OF ENDOMETRIOSIS (N/A Abdomen)  Patient location during evaluation: PACU Anesthesia Type: General Level of consciousness: awake and alert and oriented Pain management: pain level controlled Vital Signs Assessment: post-procedure vital signs reviewed and stable Respiratory status: spontaneous breathing Cardiovascular status: blood pressure returned to baseline Anesthetic complications: no     Last Vitals:  Vitals:   05/13/17 0956 05/13/17 1042  BP: (!) 112/57 109/62  Pulse: 74 73  Resp: 18 18  Temp: 36.8 C   SpO2: 100% 100%    Last Pain:  Vitals:   05/13/17 1042  TempSrc:   PainSc: 3                  Nacole Fluhr

## 2017-05-13 NOTE — Anesthesia Procedure Notes (Signed)
Procedure Name: Intubation Date/Time: 05/13/2017 7:45 AM Performed by: Jonna Clark Pre-anesthesia Checklist: Patient identified, Patient being monitored, Timeout performed, Emergency Drugs available and Suction available Patient Re-evaluated:Patient Re-evaluated prior to induction Oxygen Delivery Method: Circle system utilized Preoxygenation: Pre-oxygenation with 100% oxygen Induction Type: IV induction Ventilation: Mask ventilation without difficulty Laryngoscope Size: Mac and 3 Grade View: Grade I Tube type: Oral Tube size: 7.0 mm Number of attempts: 1 Airway Equipment and Method: Stylet Placement Confirmation: ETT inserted through vocal cords under direct vision,  positive ETCO2 and breath sounds checked- equal and bilateral Secured at: 21 cm Tube secured with: Tape Dental Injury: Teeth and Oropharynx as per pre-operative assessment

## 2017-05-13 NOTE — Anesthesia Post-op Follow-up Note (Signed)
Anesthesia QCDR form completed.        

## 2017-05-13 NOTE — Interval H&P Note (Signed)
History and Physical Interval Note:  05/13/2017 7:27 AM  Michelle [name]  has presented today for surgery, with the diagnosis of LEFT OVARIAN CYST, DYSMENORRHEA  The various methods of treatment have been discussed with the patient and family. After consideration of risks, benefits and other options for treatment, the patient has consented to  Procedure(s): LAPAROSCOPIC LEFT OVARIAN CYSTECTOMY WITH PERITONEAL BIOPSIE (Left) as a surgical intervention .  The patient's history has been reviewed, patient examined, no change in status, stable for surgery.  I have reviewed the patient's chart and labs.  Questions were answered to the patient's satisfaction.     Hassell Done A Saraia Platner

## 2017-05-13 NOTE — Discharge Instructions (Addendum)
Laparoscopic left ovarian cystectomy, and excision and fulguration of endometriosis, Care After Refer to this sheet in the next few weeks. These instructions provide you with information about caring for yourself after your procedure. Your health care provider may also give you more specific instructions. Your treatment has been planned according to current medical practices, but problems sometimes occur. Call your health care provider if you have any problems or questions after your procedure. What can I expect after the procedure? After your procedure, it is common to have some pain around the incision. Follow these instructions at home:  Take medicines only as directed by your health care provider.  You may need to start by eating only a little at a time. Start with liquids. As your appetite improves, gradually return to eating solid foods.  Do not lift anything that is heavier than 10 lb (4.5 kg) for four weeks.  Return to your normal activities as directed by your health care provider. Ask your health care provider what activities are safe for you.  There are many different ways to close and cover an incision, including stitches (sutures), skin glue, and adhesive strips. Follow instructions from your health care provider about: ? Incision care. ? Bandage (dressing) changes and removal. ? Incision closure removal.  Check your incisions every day for signs of infection. Watch for: ? Redness, swelling, or pain. ? Fluid , blood, or pus. Contact a health care provider if:  You develop a fever or chills.  You have redness, swelling, or pain at the site of your incisions.  You have fluid, blood, or pus coming from your incisions.  There is a bad smell coming from your incisions or the dressing.  Your pain gets worse.  You have a cough.  You have nausea and vomiting that does not go away after 3 hours. Get help right away if:  You develop severe pain in your abdomen or your  chest.  You develop shortness of breath.  You develop nausea or vomiting that is severe or keeps coming back. This information is not intended to replace advice given to you by your health care provider. Make sure you discuss any questions you have with your health care provider. Document Released: 11/23/2014 Document Revised: 12/15/2015 Document Reviewed: 07/05/2014 Elsevier Interactive Patient Education  [employer].   Endometriosis Endometriosis is a condition in which the tissue that lines the uterus (endometrium) grows outside of its normal location. The tissue may grow in many locations close to the uterus, but it commonly grows on the ovaries, fallopian tubes, vagina, or bowel. When the uterus sheds the endometrium every menstrual cycle, there is bleeding wherever the endometrial tissue is located. This can cause pain because blood is irritating to tissues that are not normally exposed to it. What are the causes? The cause of endometriosis is not known. What increases the risk? You may be more likely to develop endometriosis if you:  Have a family history of endometriosis.  Have never given birth.  Started your period at age 30 or younger.  Have high levels of estrogen in your body.  Were exposed to a certain medicine (diethylstilbestrol) before you were born (in utero).  Had low birth weight.  Were born as a twin, triplet, or other multiple.  Have a BMI of less than 25. BMI is an estimate of body fat and is calculated from height and weight.  What are the signs or symptoms? Often, there are no symptoms of this condition. If you  do have symptoms, they may:  Vary depending on where your endometrial tissue is growing.  Occur during your menstrual period (most common) or midcycle.  Come and go, or you may go months with no symptoms at all.  Stop with menopause.  Symptoms may include:  Pain in the back or abdomen.  Heavier bleeding during periods.  Pain  during sex.  Painful bowel movements.  Infertility.  Pelvic pain.  Bleeding more than once a month.  How is this diagnosed? This condition is diagnosed based on your symptoms and a physical exam. You may have tests, such as:  Blood tests and urine tests. These may be done to help rule out other possible causes of your symptoms.  Ultrasound, to look for abnormal tissues.  An X-ray of the lower bowel (barium enema).  An ultrasound that is done through the vagina (transvaginally).  CT scan.  MRI.  Laparoscopy. In this procedure, a lighted, pencil-sized instrument called a laparoscope is inserted into your abdomen through an incision. The laparoscope allows your health care provider to look at the organs inside your body and check for abnormal tissue to confirm the diagnosis. If abnormal tissue is found, your health care provider may remove a small piece of tissue (biopsy) to be examined under a microscope.  How is this treated? Treatment for this condition may include:  Medicines to relieve pain, such as NSAIDs.  Hormone therapy. This involves using artificial (synthetic) hormones to reduce endometrial tissue growth. Your health care provider may recommend using a hormonal form of birth control, or other medicines.  Surgery. This may be done to remove abnormal endometrial tissue. ? In some cases, tissue may be removed using a laparoscope and a laser (laparoscopic laser treatment). ? In severe cases, surgery may be done to remove the fallopian tubes, uterus, and ovaries (hysterectomy).  Follow these instructions at home:  Take over-the-counter and prescription medicines only as told by your health care provider.  Do not drive or use heavy machinery while taking prescription pain medicine.  Try to avoid activities that cause pain, including sexual activity.  Keep all follow-up visits as told by your health care provider. This is important. Contact a health care provider  if:  You have pain in the area between your hip bones (pelvic area) that occurs: ? Before, during, or after your period. ? In between your period and gets worse during your period. ? During or after sex. ? With bowel movements or urination, especially during your period.  You have problems getting pregnant.  You have a fever. Get help right away if:  You have severe pain that does not get better with medicine.  You have severe nausea and vomiting, or you cannot eat without vomiting.  You have pain that affects only the lower, right side of your abdomen.  You have abdominal pain that gets worse.  You have abdominal swelling.  You have blood in your stool. This information is not intended to replace advice given to you by your health care provider. Make sure you discuss any questions you have with your health care provider. Document Released: 07/06/2000 Document Revised: 04/13/2016 Document Reviewed: 12/10/2015 Elsevier Interactive Patient Education  2018 [place]   1) The drugs that you were given will stay in your system until tomorrow so for the next 24 hours you should not:  A) Drive an automobile B) Make any legal decisions C) Drink any alcoholic beverage  2) You may resume regular meals tomorrow.  Today it is better to start with liquids and gradually work up to solid foods.  You may eat anything you prefer, but it is better to start with liquids, then soup and crackers, and gradually work up to solid foods.   3) Please notify your doctor immediately if you have any unusual bleeding, trouble breathing, redness and pain at the surgery site, drainage, fever, or pain not relieved by medication.    4) Additional Instructions:        Please contact your physician with any problems or Same Day Surgery at [phone], Monday through Friday 6 am to 4 pm, or [place] at [facility] number at  [phone].

## 2017-05-13 NOTE — H&P (View-Only) (Signed)
Subjective:  PREOPERATIVE HISTORY AND PHYSICAL   Date of surgery: 05/13/2017 Procedure: Laparoscopy with peritoneal biopsies and left ovarian cystectomy Diagnoses: 1. Pelvic pain 2. Dysmenorrhea 3. Left ovarian cyst   Patient is a 26 y.o. [id number] scheduled for surgery on 05/13/2017.  Patient has history of left lower quadrant pain and left ovarian cyst along with possible septate uterus. Due to the degree of painful symptoms, she desires workup including probable left ovarian cystectomy and peritoneal biopsies for rule out endometriosis.   Gynecologic history: Menarche: Age 28 Age [age]-intervals where irregular with approximately 3 cycles per year; menses were heavy and painful Age [age] (2010) amenorrhea developed until 2015 when cycles became spontaneous again; evaluation during this timeframe revealed a potential septate uterus and a 6.1 cm left ovarian cyst which remained stable on serial ultrasounds. Last ultrasound was performed in 2012. Age 57 - present-regular spontaneous cycles; interval 30-40 days; duration of flow 4 days  Patient does report significant dysmenorrhea with bilateral pelvic cramping as well as low back ache with radiation into the buttocks and thighs bilaterally. She has never been diagnosed with endometriosis. She has never had a pelvic exam.  OB History    Gravida Para Term Preterm AB Living   0 0 0 0 0 0   SAB TAB Ectopic Multiple Live Births   0 0 0 0 0      Patient's last menstrual period was 04/06/2017 (exact date).    Past Medical History:  Diagnosis Date  . Diabetes mellitus without complication ([place])   . Ovarian cyst   . PTSD (post-traumatic stress disorder)   . Septate uterus     Past Surgical History:  Procedure Laterality Date  . NO PAST SURGERIES      OB History  Gravida Para Term Preterm AB Living  0 0 0 0 0 0  SAB TAB Ectopic Multiple Live Births  0 0 0 0 0        Social History   Social History  . Marital  status: Single    Spouse name: N/A  . Number of children: N/A  . Years of education: N/A   Social History Main Topics  . Smoking status: Never Smoker  . Smokeless tobacco: Never Used  . Alcohol use No  . Drug use: No  . Sexual activity: Yes    Birth control/ protection: Condom   Other Topics Concern  . None   Social History Narrative  . None    Family History  Problem Relation Age of Onset  . Diabetes Mother   . Healthy Father   . Heart disease Maternal Grandmother   . Breast cancer Neg Hx   . Ovarian cancer Neg Hx   . Colon cancer Neg Hx      (Not in a hospital admission)  Allergies  Allergen Reactions  . Zoloft [Sertraline Hcl] Itching    Review of Systems Constitutional: No recent fever/chills/sweats Respiratory: No recent cough/bronchitis Cardiovascular: No chest pain Gastrointestinal: No recent nausea/vomiting/diarrhea Genitourinary: No UTI symptoms Hematologic/lymphatic:No history of coagulopathy or recent blood thinner use    Objective:    BP 103/69   Pulse 80   Ht 5\' 10"  (1.778 m)   Wt 143 lb 3.2 oz (65 kg)   LMP 04/06/2017 (Exact Date)   BMI 20.55 kg/m   General:   Normal  Skin:   normal  HEENT:  Normal  Neck:  Supple without Adenopathy or Thyromegaly  Lungs:   Heart:  Breasts:   Abdomen:  Pelvis:  M/S   Extremeties:  Neuro:    clear to auscultation bilaterally   Normal without murmur   Not Examined   soft, non-tender; bowel sounds normal; no masses,  no organomegaly   Exam deferred to OR  No CVAT  Warm/Dry   Normal       02/08/2017 Pelvic exam: External genitalia-Normal BUS-normal Vagina-normal estrogen effect; bloody mucus/menses in vault Cervix-bloody mucus at os: Nulliparous; no cervical motion tenderness Uterus-small, mobile, retroverted, nontender Adnexa-nonpalpable and nontender; no obvious fullness or mass in the left adnexa Rectovaginal-normal external exam  02/18/2017 ULTRASOUND  REPORT  Location: [facility] Date of Service: 02/18/17   Indications:Pelvic Pain Findings: The uterus measures 7.1 x 4.1 x 5.2 cm. Echo texture is homogenouswithoutevidence of focal masses. There is fluid seen within the cervix.  The Endometriummeasures 6.3 mm.  Right Ovary measures 3.3 x 1.6 x 2.1 cm. It is normal in appearance. Left Ovary measures 9.5 x 6 x 5.3 cm. There is a large simple cyst measuring 5.7 x 3.8 x 6.2 cm. Blood flow is sen in the left ovary using color doppler. Survey of the adnexa demonstrates no adnexal masses. There is no free fluid in the cul de sac.  Impression: 1. There is a large simple cyst of the left measuring 5.7 x 3.8 x 6.2 cm. 2. Fluid seen in cervix   Assessment:     1. Left ovarian cyst, simple, measuring 6.2 cm in greatest diameter 2. History of dysmenorrhea   Plan:  Laparoscopy with peritoneal biopsies and left ovarian cystectomy  Preoperative counseling: The patient is to undergo laparoscopy with peritoneal biopsies and left ovarian cystectomy on 05/13/2017. She is understanding of the planned procedures and is aware of and is accepting of all surgical risks which include but are not limited to bleeding, infection, pelvic organ injury with a 4 prep, blood clot disorders, anesthesia risks, etc. All questions have been answered. Informed consent is given. Patient is ready and willing to proceed with surgery as scheduled.  Brayton Mars, MD  Note: This dictation was prepared with Dragon dictation along with smaller phrase technology. Any transcriptional errors that result from this process are unintentional.

## 2017-05-13 NOTE — Anesthesia Preprocedure Evaluation (Signed)
Anesthesia Evaluation  Patient identified by MRN, date of birth, ID band Patient awake    Reviewed: Allergy & Precautions, NPO status , Patient's Chart, lab work & pertinent test results  Airway Mallampati: II  TM Distance: >3 FB     Dental  (+) Teeth Intact   Pulmonary neg pulmonary ROS,    Pulmonary exam normal        Cardiovascular negative cardio ROS Normal cardiovascular exam     Neuro/Psych PSYCHIATRIC DISORDERS Anxiety negative neurological ROS     GI/Hepatic negative GI ROS, Neg liver ROS,   Endo/Other  diabetes, Well Controlled, Type 1, Insulin Dependent  Renal/GU negative Renal ROS  Female GU complaint     Musculoskeletal negative musculoskeletal ROS (+)   Abdominal Normal abdominal exam  (+)   Peds negative pediatric ROS (+)  Hematology negative hematology ROS (+)   Anesthesia Other Findings   Reproductive/Obstetrics                             Anesthesia Physical Anesthesia Plan  ASA: II  Anesthesia Plan: General   Post-op Pain Management:    Induction: Intravenous  PONV Risk Score and Plan:   Airway Management Planned: Oral ETT  Additional Equipment:   Intra-op Plan:   Post-operative Plan: Extubation in OR  Informed Consent: I have reviewed the patients History and Physical, chart, labs and discussed the procedure including the risks, benefits and alternatives for the proposed anesthesia with the patient or authorized representative who has indicated his/her understanding and acceptance.   Dental advisory given  Plan Discussed with: CRNA and Surgeon  Anesthesia Plan Comments:         Anesthesia Quick Evaluation

## 2017-05-13 NOTE — OR Nursing (Signed)
fsbs 234 @ 1012 - pt advised her insulin pump is running.  Dr. Kayleen Memos in to see and notified pt to bolus if she feels necessity

## 2017-05-13 NOTE — Op Note (Signed)
OPERATIVE NOTE:  Michelle [name] PROCEDURE DATE: 05/13/2017   PREOPERATIVE DIAGNOSIS:  1. 7 cm left ovarian cyst, septated, symptomatic 2. Dysmenorrhea  POSTOPERATIVE DIAGNOSIS:  1. 7 cm left ovarian cyst, septated, symptomatic 2. Endometriosis 3. Dysmenorrhea  PROCEDURE:  1. Laparoscopic left ovarian cystectomy  2. Laparoscopic excision and fulguration of endometriosis  SURGEON:  Michelle Mars, MD ASSISTANTS: Michelle [name], M.D. ANESTHESIA: General INDICATIONS: 26 y.o. G0P0000 with long history of dysmenorrhea, and a persistent symptomatic 7 cm left ovarian cyst, presents for surgical evaluation and management.  FINDINGS:   1. A 7 cm left ovarian cyst, with serous fluid, is drained and excised; the cyst wall was thick. No excrescences were identified. 2. Diffuse endometriosis implants were noted on the left bladder peritoneum, extensively throughout the cul-de-sac, on the right uterosacral ligament, and right ovarian fossa. Besides the powder burn implants as noted, were white lesions of suspected endometriosis in the left ovarian fossa. The appendix was normal. Upper abdomen including liver and diaphragm were normal. The right and left ovaries were normal. The fallopian tubes were normal. Uterus was normal. Left pelvic sidewall adhesions were lysed following the ovarian cystectomy.   I/O's: No intake/output data recorded. COUNTS:  YES SPECIMENS:  1. Left ovarian cyst wall 2. Left uterosacral ligament biopsy 3. Cul-de-sac biopsies 4. Left bladder flap biopsies  ANTIBIOTIC PROPHYLAXIS:N/A COMPLICATIONS: None immediate  PROCEDURE IN DETAIL: The patient was brought to the operating room and placed in the supine position. General endotracheal anesthesia was induced without difficulty. She was placed in the dorsal lithotomy position using the bumblebee stirrups. A ChloraPrep and Hibiclens abdominal perineal intravaginal prep and drape was performed in standard  fashion. Timeout was completed. Hulka tenaculum was placed on the cervix to facilitate uterine manipulation. A red Robinson catheter was used to drain 50 mL of urine from the bladder. Laparoscopy was performed in standard fashion. Subumbilical vertical incision 5 mm in length was made. The Optiview laparoscopic trocar system was used to place a 5 mm port directly into the abdominal pelvic cavity without evidence of bowel or vascular injury. 2 additional 5 mmHg ports were placed in the right and left lower quadrant areas respectively under direct visualization. The above-noted findings were photo documented. The left ovarian cystectomy was performed using the Ace Harmonic scalpel to incise the ovarian cyst wall and evacuate the serous fluid. Laparoscopic graspers were then used to peel the ovarian cyst from the ovary. The ovarian cyst wall was noted to be thickened without evidence of excrescences. No nodularity was noted. The cyst wall was removed through the 5 mm port and sent to pathology. The adhesions between the left adnexa and pelvic sidewall were taken down using the Ace Harmonic scalpel. This restored all normal anatomy. The endometriosis implants were then excised and cauterized. The right uterosacral ligament biopsy was taken. This was followed by numerous excisional biopsies of the cul-de-sac powder burn implants. The left ladder serosa was then biopsied with removal of the powder burn implants. All the biopsy areas were then fulgurated with Kleppinger bipolar forceps. Copious irrigation of the pelvis was performed and the irrigant fluid was aspirated. Once satisfied with total hemostasis, the procedure was terminated with all instrumentation being removed from the abdominal pelvic cavity. Pneumoperitoneum was released. The incisions were closed with 4-0 Monocryl suture in a simple interrupted manner. Dermabond glue was placed over the incisions. Patient was then awakened mobilized and taken in  satisfactory condition.  Estimated blood loss: 5 cc Urine output: 80 cc  IV fluids: Per anesthesia worksheet  Michelle Done A. Zipporah Plants, MD, ACOG [facility]

## 2017-05-13 NOTE — Transfer of Care (Signed)
Immediate Anesthesia Transfer of Care Note  Patient: Michelle [name]  Procedure(s) Performed: LAPAROSCOPIC LEFT OVARIAN CYSTECTOMY WITH PERITONEAL BIOPSIE (Left Abdomen) FULGURATION OF ENDOMETRIOSIS (N/A Abdomen)  Patient Location: PACU  Anesthesia Type:General  Level of Consciousness: sedated  Airway & Oxygen Therapy: Patient Spontanous Breathing and Patient connected to face mask oxygen  Post-op Assessment: Report given to RN and Post -op Vital signs reviewed and stable  Post vital signs: Reviewed and stable  Last Vitals:  Vitals:   05/13/17 0632  BP: 117/79  Pulse: 90  Resp: 16  Temp: 36.5 C  SpO2: 100%    Last Pain:  Vitals:   05/13/17 4446  TempSrc: Oral         Complications: No apparent anesthesia complications

## 2017-05-14 LAB — SURGICAL PATHOLOGY

## 2017-05-20 ENCOUNTER — Encounter: Payer: Self-pay | Admitting: Obstetrics and Gynecology

## 2017-05-21 ENCOUNTER — Encounter: Payer: Federal, State, Local not specified - PPO | Admitting: Obstetrics and Gynecology

## 2017-05-23 ENCOUNTER — Encounter: Payer: Self-pay | Admitting: Obstetrics and Gynecology

## 2017-05-23 ENCOUNTER — Ambulatory Visit (INDEPENDENT_AMBULATORY_CARE_PROVIDER_SITE_OTHER): Payer: Federal, State, Local not specified - PPO | Admitting: Obstetrics and Gynecology

## 2017-05-23 VITALS — BP 105/68 | HR 84 | Ht 70.0 in | Wt 137.9 lb

## 2017-05-23 DIAGNOSIS — Z09 Encounter for follow-up examination after completed treatment for conditions other than malignant neoplasm: Secondary | ICD-10-CM

## 2017-05-23 DIAGNOSIS — N83202 Unspecified ovarian cyst, left side: Secondary | ICD-10-CM

## 2017-05-23 DIAGNOSIS — D271 Benign neoplasm of left ovary: Secondary | ICD-10-CM | POA: Insufficient documentation

## 2017-05-23 DIAGNOSIS — N809 Endometriosis, unspecified: Secondary | ICD-10-CM

## 2017-05-23 MED ORDER — NORETHIN-ETH ESTRAD-FE BIPHAS 1 MG-10 MCG / 10 MCG PO TABS: 1.0000 | ORAL_TABLET | Freq: Every day | ORAL | 11 refills | Status: DC

## 2017-05-23 NOTE — Progress Notes (Signed)
Chief complaint: 1.  Postop check 2.  Status post laparoscopic left ovarian cystectomy and excision and fulguration of endometriosis  Patient presents today for follow-up after surgery last week.  She is doing well with normal bowel and bladder function.  She has mild incisional pain.  Findings from surgery have been reviewed. Pathology: DIAGNOSIS:  A. UTEROSACRAL LIGAMENT, RIGHT; BIOPSY:  - ENDOMETRIOSIS.   B. CUL-DE-SAC; BIOPSY:  - ENDOMETRIOSIS.   C. OVARIAN CYST, LEFT; EXCISION:  - SEROUS CYSTADENOMA.   D. BLADDER SEROSA; BIOPSY:  - ENDOMETRIOSIS.    BP 105/68   Pulse 84   Ht 5\' 10"  (1.778 m)   Wt 137 lb 14.4 oz (62.6 kg)   LMP 05/08/2017 (Exact Date)   BMI 19.79 kg/m  Well-appearing female no acute distress.  Alert and oriented. Abdomen: Soft, flat, nontender; laparoscopy port sites are well healed and covered with Dermabond glue. Pelvic: Deferred  ASSESSMENT: 1.  Status post laparoscopic excision of left adnexal cyst and excision and fulguration of endometriosis 2.  The pathology notable for endometriosis and left ovarian serous cystadenoma 3.  History of intolerance to 20 mcg OCPs  PLAN: 1.  Options of endometriosis management have been reviewed including various medical therapies such as OCPs, progestin IUD, Depo-Provera, danazol, and Depo-Lupron. 2.  Patient is to go on a trial of Lo Loestrin oral contraceptive 3.  Return in 3 months for follow-up 4.  Resume activities as tolerated  Brayton Mars, MD  Note: This dictation was prepared with Dragon dictation along with smaller phrase technology. Any transcriptional errors that result from this process are unintentional.

## 2017-05-23 NOTE — Patient Instructions (Signed)
1.  Begin Lo Loestrin oral contraceptives 2.  Return in 3 months for follow-up 3.  Resume routine activities as tolerated

## 2017-07-19 ENCOUNTER — Telehealth: Payer: Self-pay | Admitting: Obstetrics and Gynecology

## 2017-07-19 NOTE — Telephone Encounter (Signed)
Pt states the ocp are making her nauseous. She would like to try skyla. Pt aware she has to be no sex x 2 weeks and neg upt. Encouraged pt to take ocp at bedtime. Continue with ocp until seen at next visit.

## 2017-07-19 NOTE — Telephone Encounter (Signed)
lmtrc

## 2017-07-19 NOTE — Telephone Encounter (Signed)
The patient would like a call back to confirm that she will be able to get the [facility]) IUD placed at her next appointment. Please advise.

## 2017-07-31 ENCOUNTER — Ambulatory Visit (INDEPENDENT_AMBULATORY_CARE_PROVIDER_SITE_OTHER): Payer: Federal, State, Local not specified - PPO | Admitting: Obstetrics and Gynecology

## 2017-07-31 ENCOUNTER — Encounter: Payer: Self-pay | Admitting: Obstetrics and Gynecology

## 2017-07-31 VITALS — BP 130/75 | HR 76 | Ht 70.0 in | Wt 144.0 lb

## 2017-07-31 DIAGNOSIS — Z30430 Encounter for insertion of intrauterine contraceptive device: Secondary | ICD-10-CM | POA: Diagnosis not present

## 2017-07-31 DIAGNOSIS — N946 Dysmenorrhea, unspecified: Secondary | ICD-10-CM

## 2017-07-31 DIAGNOSIS — N809 Endometriosis, unspecified: Secondary | ICD-10-CM

## 2017-07-31 LAB — POCT URINE PREGNANCY: Preg Test, Ur: NEGATIVE

## 2017-07-31 NOTE — Patient Instructions (Signed)
1.  Mirena IUD is inserted today 2.  Pros and cons of Skyla IUD versus Mirena IUD were addressed.  Optimal benefit will be achieved through Mirena IUD because of the higher progestin content which is more effective for endometriosis management 3.  Return in 4 weeks for IUD string check   Intrauterine Device Insertion, Care After This sheet gives you information about how to care for yourself after your procedure. Your health care provider may also give you more specific instructions. If you have problems or questions, contact your health care provider. What can I expect after the procedure? After the procedure, it is common to have:  Cramps and pain in the abdomen.  Light bleeding (spotting) or heavier bleeding that is like your menstrual period. This may last for up to a few days.  Lower back pain.  Dizziness.  Headaches.  Nausea.  Follow these instructions at home:  Before resuming sexual activity, check to make sure that you can feel the IUD string(s). You should be able to feel the end of the string(s) below the opening of your cervix. If your IUD string is in place, you may resume sexual activity. ? If you had a hormonal IUD inserted more than 7 days after your most recent period started, you will need to use a backup method of birth control for 7 days after IUD insertion. Ask your health care provider whether this applies to you.  Continue to check that the IUD is still in place by feeling for the string(s) after every menstrual period, or once a month.  Take over-the-counter and prescription medicines only as told by your health care provider.  Do not drive or use heavy machinery while taking prescription pain medicine.  Keep all follow-up visits as told by your health care provider. This is important. Contact a health care provider if:  You have bleeding that is heavier or lasts longer than a normal menstrual cycle.  You have a fever.  You have cramps or abdominal pain  that get worse or do not get better with medicine.  You develop abdominal pain that is new or is not in the same area of earlier cramping and pain.  You feel lightheaded or weak.  You have abnormal or bad-smelling discharge from your vagina.  You have pain during sexual activity.  You have any of the following problems with your IUD string(s): ? The string bothers or hurts you or your sexual partner. ? You cannot feel the string. ? The string has gotten longer.  You can feel the IUD in your vagina.  You think you may be pregnant, or you miss your menstrual period.  You think you may have an STI (sexually transmitted infection). Get help right away if:  You have flu-like symptoms.  You have a fever and chills.  You can feel that your IUD has slipped out of place. Summary  After the procedure, it is common to have cramps and pain in the abdomen. It is also common to have light bleeding (spotting) or heavier bleeding that is like your menstrual period.  Continue to check that the IUD is still in place by feeling for the string(s) after every menstrual period, or once a month.  Keep all follow-up visits as told by your health care provider. This is important.  Contact your health care provider if you have problems with your IUD string(s), such as the string getting longer or bothering you or your sexual partner. This information is not intended  to replace advice given to you by your health care provider. Make sure you discuss any questions you have with your health care provider. Document Released: 03/07/2011 Document Revised: 05/30/2016 Document Reviewed: 05/30/2016 Elsevier Interactive Patient Education  2017 [employer].

## 2017-07-31 NOTE — Progress Notes (Signed)
Chief complaint: 1.  Endometriosis 2.  IUD insertion  Patient presents for what was thought to be Skyla IUD insertion.  The patient initially wanted Skyla IUD because of lower progestin content.  However, she has symptomatic endometriosis and the Mirena IUD with higher progestin content will have better impact in controlling endometriosis symptoms.  Pros and cons of each IUD were addressed.  Patient has made the decision to proceed with Mirena IUD insertion due to the beneficial effects for endometriosis symptoms.  Past medical history, past surgical history, problem list, medications, and allergies are reviewed  OBJECTIVE: BP 130/75   Pulse 76   Ht 5\' 10"  (1.778 m)   Wt 144 lb (65.3 kg)   LMP 06/23/2017 (Exact Date)   BMI 20.66 kg/m  Pleasant well-appearing female no acute distress.  Alert and oriented.  Affect is appropriate. Abdomen: Soft, nontender Pelvic exam: Bimanual-midplane uterus of normal size and shape, mobile, and slightly tender 1/4; adnexa nonpalpable and nontender External genitalia-normal BUS-normal Vagina-normal Cervix-nulliparous; no cervical motion tenderness  PROCEDURE: 1.  Mirena IUD insertion 2.  Paracervical block  IUD Insertion Procedure Note  Pre-operative Diagnosis: Endometriosis; dysmenorrhea  Post-operative Diagnosis: same  Indications: contraception  Procedure Details  Urine pregnancy test was done  and result was negative.  The risks (including infection, bleeding, pain, and uterine perforation) and benefits of the procedure were explained to the patient and Verbal informed consent was obtained.    Cervix cleansed with Betadine.  Paracervical block was performed with 1% lidocaine without epinephrine with a total of 10 cc being injected at the 3 and 9:00 positions of the cervix.  Single-toothed tenaculum was applied to the anterior lip of the cervix.  Uterus sounded to 7 cm.  Mirena IUD inserted without difficulty. String visible and trimmed to 3  cm. Patient tolerated procedure well.  IUD Information: Verdis Frederickson, Lot # [id number], Expiration date May 2021.  Condition: Stable  Complications: None  Plan:  The patient was advised to call for any fever or for prolonged or severe pain or bleeding. She was advised to use OTC acetaminophen and OTC ibuprofen as needed for mild to moderate pain.   Attending Physician Documentation: Brayton Mars, MD  ASSESSMENT: 1.  Symptomatic endometriosis 2.  Desires hormonal (progestin) IUD for management of symptoms 3.  Counseling regarding different IUD dosages reviewed and questions answered  PLAN: 1.  Contraceptive management planning discussed with aid in decision making of IUD choice 2.  Mirena IUD insertion is noted 3.  Return in 4 weeks for IUD string check  A total of 15 minutes were spent face-to-face with the patient during this encounter and over half of that time dealt with counseling and coordination of care.  Brayton Mars, MD  Note: This dictation was prepared with Dragon dictation along with smaller phrase technology. Any transcriptional errors that result from this process are unintentional.

## 2017-08-28 ENCOUNTER — Encounter: Payer: Self-pay | Admitting: Obstetrics and Gynecology

## 2017-08-28 ENCOUNTER — Ambulatory Visit: Payer: Federal, State, Local not specified - PPO | Admitting: Obstetrics and Gynecology

## 2017-08-28 VITALS — BP 104/70 | HR 72 | Ht 70.0 in | Wt 136.0 lb

## 2017-08-28 DIAGNOSIS — Z30431 Encounter for routine checking of intrauterine contraceptive device: Secondary | ICD-10-CM | POA: Diagnosis not present

## 2017-08-28 DIAGNOSIS — N809 Endometriosis, unspecified: Secondary | ICD-10-CM

## 2017-08-28 DIAGNOSIS — N946 Dysmenorrhea, unspecified: Secondary | ICD-10-CM | POA: Diagnosis not present

## 2017-08-28 NOTE — Progress Notes (Signed)
Chief complaint: 1.  4-week IUD check 2.  Follow-up on endometriosis 3.  History of severe dysmenorrhea 4.  History of left ovarian serous cystadenoma, status post excision of left ovarian cyst  Patient presents for IUD string check 4 weeks after Mirena IUD insertion.  She has had one menses since insertion and bleeding was average with minimal cramps over 1 day. Bowel function is normal Function is normal  Past medical history, past surgical history, problem list, medications, and allergies are reviewed  OBJECTIVE: BP 104/70   Pulse 72   Ht 5\' 10"  (1.778 m)   Wt 136 lb (61.7 kg)   LMP 08/09/2017 (Exact Date)   BMI 19.51 kg/m  Pleasant female in no acute distress.  Alert and oriented. Abdomen: Soft, nontender; no organomegaly Pelvic exam: External genitalia-normal BUS-normal Vagina-normal; minimal white secretions in vaginal vault Cervix-IUD strings 2 cm; no cervical motion tenderness Uterus-midplane, small, nontender, mobile Adnexa-nonpalpable nontender Rectovaginal-normal external exam Extremities: Warm and dry without edema  ASSESSMENT: 1.  Normal IUD string check 4 weeks status post insertion-Mirena 2.  History of severe dysmenorrhea secondary to endometriosis 3.  History of left ovarian serous cystadenoma, status post left ovarian cystectomy  PLAN: 1.  Monitor for endometriosis symptoms 2.  Return in 6 months for follow-up on endometriosis  Brayton Mars, MD  Note: This dictation was prepared with Dragon dictation along with smaller phrase technology. Any transcriptional errors that result from this process are unintentional.

## 2017-08-28 NOTE — Patient Instructions (Signed)
1.  Return in 6 months for follow-up on endometriosis

## 2017-11-27 ENCOUNTER — Ambulatory Visit (INDEPENDENT_AMBULATORY_CARE_PROVIDER_SITE_OTHER): Payer: Federal, State, Local not specified - PPO

## 2017-11-27 ENCOUNTER — Encounter: Payer: Self-pay | Admitting: Podiatry

## 2017-11-27 ENCOUNTER — Ambulatory Visit: Payer: Federal, State, Local not specified - PPO | Admitting: Podiatry

## 2017-11-27 DIAGNOSIS — M722 Plantar fascial fibromatosis: Secondary | ICD-10-CM | POA: Diagnosis not present

## 2017-11-27 NOTE — Progress Notes (Signed)
Subjective:  Patient ID: Michelle [name], female    DOB: 1990-09-24,  MRN: [id number] HPI Chief Complaint  Patient presents with  . Foot Pain    Patient presents today for bilat pain in heels and forefeet x years.  She reports the pain has become worse in the past 5 months.  She describes the pain as shooting which comes and goes and the only tx has been using heel cups and stretching with slight relief.   She is also concerned about nail fungus bilat great toenails x 3-4 months.   She denies any pain, but the discoloration is bothersome to her.  She has not used anything to treat the fungus  . Nail Problem  . New Patient (Initial Visit)    27 y.o. female presents with the above complaint.   [name]: Denies fever chills nausea vomiting muscle aches pains calf pain back pain chest pain shortness of breath.  Past Medical History:  Diagnosis Date  . Diabetes mellitus without complication ([place])   . Ovarian cyst   . PTSD (post-traumatic stress disorder)   . Septate uterus    Past Surgical History:  Procedure Laterality Date  . FULGURATION OF LESION N/A 05/13/2017   Procedure: FULGURATION OF ENDOMETRIOSIS;  Surgeon: Brayton Mars, MD;  Location: [facility] ORS;  Service: Gynecology;  Laterality: N/A;  . LAPAROSCOPIC OVARIAN CYSTECTOMY Left 05/13/2017   Procedure: LAPAROSCOPIC LEFT OVARIAN CYSTECTOMY WITH PERITONEAL BIOPSIE;  Surgeon: Brayton Mars, MD;  Location: [facility] ORS;  Service: Gynecology;  Laterality: Left;  . NO PAST SURGERIES      Current Outpatient Medications:  .  cetirizine (ZYRTEC) 10 MG tablet, Take 10 mg by mouth daily., Disp: , Rfl:  .  ibuprofen (ADVIL,MOTRIN) 800 MG tablet, ibuprofen 800 mg tablet  TAKE ONE TABLET BY MOUTH 3 TIMES DAILY, Disp: , Rfl:  .  insulin aspart (NOVOLOG) 100 UNIT/ML injection, Insulin pump, roughly 50 units per day.  Bolus 1 unit per 10 carbs, Disp: , Rfl:  .  Ketotifen Fumarate (ALLERGY EYE DROPS OP), Apply 1 drop to eye daily., Disp: ,  Rfl:  .  oxyCODONE (ROXICODONE) 5 MG immediate release tablet, Roxicodone 5 mg tablet  Take one tablet po q 4-6 hours prn pain., Disp: , Rfl:   Allergies  Allergen Reactions  . Zoloft [Sertraline Hcl] Itching   Review of Systems Objective:  There were no vitals filed for this visit.  General: Well developed, nourished, in no acute distress, alert and oriented x3   Dermatological: Skin is warm, dry and supple bilateral. Nails x 10 are well maintained; remaining integument appears unremarkable at this time. There are no open sores, no preulcerative lesions, no rash or signs of infection present.  Vascular: Dorsalis Pedis artery and Posterior Tibial artery pedal pulses are 2/4 bilateral with immedate capillary fill time. Pedal hair growth present. No varicosities and no lower extremity edema present bilateral.   Neruologic: Grossly intact via light touch bilateral. Vibratory intact via tuning fork bilateral. Protective threshold with Semmes Wienstein monofilament intact to all pedal sites bilateral. Patellar and Achilles deep tendon reflexes 2+ bilateral. No Babinski or clonus noted bilateral.   Musculoskeletal: No gross boney pedal deformities bilateral. No pain, crepitus, or limitation noted with foot and ankle range of motion bilateral. Muscular strength 5/5 in all groups tested bilateral.  Mild tenderness on palpation medial calcaneal tubercle bilateral foot.  Gait: Unassisted, Nonantalgic.    Radiographs:  3 views bilateral foot demonstrates rectus foot type.  Soft tissue  increase in density plantar fascial calcaneal insertion site no spurs noted.  Mild edema in the soft tissues of the leg.  No acute findings.    Assessment & Plan:   Assessment: Plantar fasciitis bilateral.   Plan: Discussed etiology pathology conservative versus surgical therapies.  At this point I like to get her into a pair of orthotics.  I like to follow-up with her once these orthotics command.  She will  follow-up with Liliane Channel next week.     Avan Gullett T. [place], [place]

## 2017-12-03 ENCOUNTER — Ambulatory Visit (INDEPENDENT_AMBULATORY_CARE_PROVIDER_SITE_OTHER): Payer: Federal, State, Local not specified - PPO | Admitting: Orthotics

## 2017-12-03 DIAGNOSIS — M722 Plantar fascial fibromatosis: Secondary | ICD-10-CM | POA: Diagnosis not present

## 2017-12-03 NOTE — Addendum Note (Signed)
Addended by: Velora Heckler on: 12/03/2017 10:55 AM   Modules accepted: Level of Service

## 2017-12-03 NOTE — Progress Notes (Signed)

## 2017-12-05 LAB — HM HIV SCREENING LAB: HM HIV Screening: NEGATIVE

## 2017-12-05 LAB — HM HEPATITIS C SCREENING LAB: HM Hepatitis Screen: NEGATIVE

## 2017-12-25 ENCOUNTER — Ambulatory Visit: Payer: Federal, State, Local not specified - PPO | Admitting: Orthotics

## 2017-12-25 DIAGNOSIS — M722 Plantar fascial fibromatosis: Secondary | ICD-10-CM

## 2017-12-25 NOTE — Progress Notes (Signed)
Patient came in today to pick up custom made foot orthotics.  The goals were accomplished and the patient reported no dissatisfaction with said orthotics.  Patient was advised of breakin period and how to report any issues. 

## 2018-01-28 ENCOUNTER — Telehealth: Payer: Self-pay | Admitting: Podiatry

## 2018-01-28 DIAGNOSIS — M722 Plantar fascial fibromatosis: Secondary | ICD-10-CM | POA: Diagnosis not present

## 2018-01-28 NOTE — Telephone Encounter (Signed)
Pt called asking if she could order a second pair of orthotics. She wants it billed to the insurance for 398.00

## 2018-01-30 NOTE — Telephone Encounter (Signed)
Done. Ordered Everfeet.

## 2018-02-25 ENCOUNTER — Encounter: Payer: Federal, State, Local not specified - PPO | Admitting: Obstetrics and Gynecology

## 2019-06-08 ENCOUNTER — Other Ambulatory Visit: Payer: Self-pay

## 2019-06-08 DIAGNOSIS — Z20822 Contact with and (suspected) exposure to covid-19: Secondary | ICD-10-CM

## 2019-06-09 LAB — NOVEL CORONAVIRUS, NAA: SARS-CoV-2, NAA: NOT DETECTED

## 2019-06-09 LAB — INPATIENT

## 2020-08-18 ENCOUNTER — Emergency Department: Payer: No Typology Code available for payment source

## 2020-08-18 ENCOUNTER — Emergency Department
Admission: EM | Admit: 2020-08-18 | Discharge: 2020-08-18 | Disposition: A | Discharge status: Home / Self Care | Payer: No Typology Code available for payment source | Attending: Emergency Medicine | Admitting: Emergency Medicine

## 2020-08-18 ENCOUNTER — Other Ambulatory Visit: Payer: Self-pay

## 2020-08-18 DIAGNOSIS — E109 Type 1 diabetes mellitus without complications: Secondary | ICD-10-CM | POA: Diagnosis not present

## 2020-08-18 DIAGNOSIS — Z9641 Presence of insulin pump (external) (internal): Secondary | ICD-10-CM | POA: Diagnosis not present

## 2020-08-18 DIAGNOSIS — M25531 Pain in right wrist: Secondary | ICD-10-CM | POA: Diagnosis present

## 2020-08-18 DIAGNOSIS — M778 Other enthesopathies, not elsewhere classified: Secondary | ICD-10-CM | POA: Insufficient documentation

## 2020-08-18 LAB — BASIC METABOLIC PANEL
Anion gap: 11 (ref 5–15)
BUN: 8 mg/dL (ref 6–20)
CO2: 23 mmol/L (ref 22–32)
Calcium: 9.2 mg/dL (ref 8.9–10.3)
Chloride: 103 mmol/L (ref 98–111)
Creatinine, Ser: 0.61 mg/dL (ref 0.44–1.00)
GFR, Estimated: >60 mL/min (ref >60)
Glucose, Bld: 139 mg/dL — ABNORMAL HIGH (ref 70–99)
Potassium: 3.6 mmol/L (ref 3.5–5.1)
Sodium: 137 mmol/L (ref 135–145)

## 2020-08-18 LAB — CBC
HCT: 40 % (ref 36.0–46.0)
Hemoglobin: 14.2 g/dL (ref 12.0–15.0)
MCH: 31.1 pg (ref 26.0–34.0)
MCHC: 35.5 g/dL (ref 30.0–36.0)
MCV: 87.5 fL (ref 80.0–100.0)
Platelets: 152 10*3/uL (ref 150–400)
RBC: 4.57 MIL/uL (ref 3.87–5.11)
RDW: 12.6 % (ref 11.5–15.5)
WBC: 6.6 10*3/uL (ref 4.0–10.5)
nRBC: 0 % (ref 0.0–0.2)

## 2020-08-18 MED ORDER — NAPROXEN 500 MG PO TABS
500.0000 mg | ORAL_TABLET | Freq: Once | ORAL | Status: AC
  Administered 2020-08-18: 500 mg via ORAL
  Filled 2020-08-18: qty 1

## 2020-08-18 NOTE — ED Triage Notes (Signed)
Pt arrives w cc of R wrist pain that occurred 2 days ago, states today the pain got worse. States "I think it's more of the twisting motion and unloading things from the machine". Pt is workers comp and employed at [employer]. Pain 8/10

## 2020-08-18 NOTE — ED Provider Notes (Signed)
[facility] Emergency Department Provider Note  ____________________________________________   Event Date/Time   First MD Initiated Contact with Patient 08/18/20 1916     (approximate)  I have reviewed the triage vital signs and the nursing notes.   HISTORY  Chief Complaint Wrist Pain  HPI Michelle [name] is a 30 y.o. female reports to the emergency department for evaluation of right sided wrist and finger pain that started today while utilizing a machine at work.  Patient states that she was performing a repetitive injury noticed towards the end of her shift that she was having pain in the dorsal aspect of her second and third digits on the right hand side as well as her wrist.  She denies any history of previous similar pain.  Patient reports her pain is an 8/10.  Pain is worse with flexion of the wrist as well as trying to grip.  She reports that she felt that she was having decreased grip strength.  She has not tried any alleviating factors.  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 and she works at [employer].         Past Medical History:  Diagnosis Date  . Diabetes mellitus without complication ([place])   . Ovarian cyst   . PTSD (post-traumatic stress disorder)   . Septate uterus     Patient Active Problem List   Diagnosis Date Noted  . Serous cystadenoma of left ovary 05/23/2017  . Status post laparoscopy 05/13/2017  . Endometriosis determined by laparoscopy 05/13/2017  . Anxiety 02/08/2017  . Insulin pump status 02/08/2017  . Left ovarian cyst 02/08/2017  . Septate uterus 02/08/2017  . Dysmenorrhea 02/08/2017  . Type 1 diabetes mellitus ([place]) 02/26/2011    Past Surgical History:  Procedure Laterality Date  . FULGURATION OF LESION N/A 05/13/2017   Procedure: FULGURATION OF ENDOMETRIOSIS;  Surgeon: Brayton Mars, MD;  Location: [facility] ORS;  Service: Gynecology;  Laterality: N/A;  . LAPAROSCOPIC OVARIAN CYSTECTOMY Left 05/13/2017   Procedure:  LAPAROSCOPIC LEFT OVARIAN CYSTECTOMY WITH PERITONEAL BIOPSIE;  Surgeon: Brayton Mars, MD;  Location: [facility] ORS;  Service: Gynecology;  Laterality: Left;  . NO PAST SURGERIES      Prior to Admission medications   Medication Sig Start Date End Date Taking? Authorizing Provider  cetirizine (ZYRTEC) 10 MG tablet Take 10 mg by mouth daily.    [provider]  ibuprofen (ADVIL,MOTRIN) 800 MG tablet ibuprofen 800 mg tablet  TAKE ONE TABLET BY MOUTH 3 TIMES DAILY    [provider]  insulin aspart (NOVOLOG) 100 UNIT/ML injection Insulin pump, roughly 50 units per day.  Bolus 1 unit per 10 carbs    [provider]  Ketotifen Fumarate (ALLERGY EYE DROPS OP) Apply 1 drop to eye daily.    [provider]  oxyCODONE (ROXICODONE) 5 MG immediate release tablet Roxicodone 5 mg tablet  Take one tablet po q 4-6 hours prn pain.    [provider]    Allergies Zoloft [sertraline hcl]  Family History  Problem Relation Age of Onset  . Diabetes Mother   . Healthy Father   . Heart disease Maternal Grandmother   . Breast cancer Neg Hx   . Ovarian cancer Neg Hx   . Colon cancer Neg Hx     Social History Social History   Tobacco Use  . Smoking status: Never Smoker  . Smokeless tobacco: Never Used  Vaping Use  . Vaping Use: Never used  Substance Use  Topics  . Alcohol use: Yes  . Drug use: No    Review of Systems Constitutional: No fever/chills Eyes: No visual changes. ENT: No sore throat. Cardiovascular: Denies chest pain. Respiratory: Denies shortness of breath. Gastrointestinal: No abdominal pain.  No nausea, no vomiting.  No diarrhea.  No constipation. Genitourinary: Negative for dysuria. Musculoskeletal: + Right wrist and hand pain, negative for back pain. Skin: Negative for rash. Neurological: Negative for headaches, focal weakness or numbness.   ____________________________________________   PHYSICAL EXAM:  VITAL SIGNS: ED  Triage Vitals  Enc Vitals Group     BP 08/18/20 1908 132/75     Pulse Rate 08/18/20 1908 89     Resp 08/18/20 1908 18     Temp 08/18/20 1908 98.6 F (37 C)     Temp Source 08/18/20 1908 Oral     SpO2 08/18/20 1908 99 %     Weight 08/18/20 1905 150 lb (68 kg)     Height 08/18/20 1905 5\' 10"  (1.778 m)     Head Circumference --      Peak Flow --      Pain Score 08/18/20 1905 8     Pain Loc --      Pain Edu? --      Excl. in [place]? --     Constitutional: Alert and oriented. Well appearing and in no acute distress. Eyes: Conjunctivae are normal. PERRL. EOMI. Head: Atraumatic. Nose: No congestion/rhinnorhea. Mouth/Throat: Mucous membranes are moist. Neck: No stridor.   Cardiovascular: Normal rate, regular rhythm. Grossly normal heart sounds.  Good peripheral circulation. Respiratory: Normal respiratory effort.  No retractions. Lungs CTAB. Musculoskeletal: There is mild tenderness to the extensor surface of the second and third digits of the right hand as well as the extensor surface of the wrist.  There is increased pain with forced flexion, as a sharp "stretch sensation".  Passive range of motion does not produce as much pain to the patient.  She is nontender at the elbow.  Grip strength is appropriate.  Radial pulse 2+.  Capillary refill less than 3 seconds all digits.  Negative Tinel's Neurologic:  Normal speech and language. No gross focal neurologic deficits are appreciated. No gait instability. Skin:  Skin is warm, dry and intact. No rash noted. Psychiatric: Mood and affect are normal. Speech and behavior are normal.  ____________________________________________   LABS (all labs ordered are listed, but only abnormal results are displayed)  Labs Reviewed  BASIC METABOLIC PANEL - Abnormal; Notable for the following components:      Result Value   Glucose, Bld 139 (*)    All other components within normal limits  CBC  URINE DRUG SCREEN, QUALITATIVE ([facility] ONLY)  ETHANOL  POC  URINE PREG, ED    ____________________________________________  RADIOLOGY Lenoria Farrier, personally viewed and evaluated these images (plain radiographs) as part of my medical decision making, as well as reviewing the written report by the radiologist.  ED MD interpretation: No acute fracture  Official radiology report(s): DG Wrist Complete Right  Result Date: 08/18/2020 CLINICAL DATA:  29 year old female with right wrist pain. EXAM: RIGHT WRIST - COMPLETE 3+ VIEW COMPARISON:  None. FINDINGS: There is no acute fracture or dislocation. The bones are well mineralized. No arthritic changes. The soft tissues are unremarkable. IMPRESSION: Negative. Electronically Signed   By: Anner Crete M.D.   On: 08/18/2020 20:03   ____________________________________________   INITIAL IMPRESSION / ASSESSMENT AND PLAN / ED COURSE  As part of my medical  decision making, I reviewed the following data within the [place] notes reviewed and incorporated, Radiograph reviewed and Notes from prior ED visits        Patient is a 30 year old female reports to the emergency department for evaluation of right second and third digit pain as well as wrist pain after repetitive movement while at the end of her shift at work today.  See HPI for further details.  On physical exam, the patient does have some tenderness to the extensor surfaces of the second and third digits, as well as a stretching pain with flexion.  There is negative Tinel's sign.  X-rays negative for acute fracture.  Findings as well as history most consistent with tendinitis of the extensor carpi radialis, though carpal tunnel syndrome not completely excluded.  Will initiate treatment with anti-inflammatories as well as wrist brace.  We will have the patient follow-up with orthopedics.  Patient is amenable with this plan, will take over-the-counter Naprosyn at home.  Patient is stable this time for outpatient  therapy.      ____________________________________________   FINAL CLINICAL IMPRESSION(S) / ED DIAGNOSES  Final diagnoses:  Right wrist tendinitis     ED Discharge Orders    None      *Please note:  Michelle [name] was evaluated in Emergency Department on 08/18/2020 for the symptoms described in the history of present illness. She was evaluated in the context of the global COVID-19 pandemic, which necessitated consideration that the patient might be at risk for infection with the SARS-CoV-2 virus that causes COVID-19. Institutional protocols and algorithms that pertain to the evaluation of patients at risk for COVID-19 are in a state of rapid change based on information released by regulatory bodies including the CDC and federal and state organizations. These policies and algorithms were followed during the patient's care in the ED.  Some ED evaluations and interventions may be delayed as a result of limited staffing during and the pandemic.*   Note:  This document was prepared using Dragon voice recognition software and may include unintentional dictation errors.   Marlana Salvage, PA 08/18/20 2308    Blake Divine, MD 08/22/20 [phone]

## 2020-11-02 ENCOUNTER — Other Ambulatory Visit: Payer: Self-pay

## 2020-11-02 ENCOUNTER — Other Ambulatory Visit: Payer: Self-pay | Admitting: Obstetrics and Gynecology

## 2020-11-02 ENCOUNTER — Encounter
Admission: RE | Admit: 2020-11-02 | Discharge: 2020-11-02 | Disposition: A | Discharge status: Home / Self Care | Payer: BC Managed Care – PPO | Source: Ambulatory Visit | Attending: Obstetrics and Gynecology | Admitting: Obstetrics and Gynecology

## 2020-11-02 NOTE — Patient Instructions (Signed)
Your procedure is scheduled on: 11/11/20 Report to [place]. To find out your arrival time please call [phone] between 1PM - 3PM on 11/10/20.  Remember: Instructions that are not followed completely may result in serious medical risk, up to and including death, or upon the discretion of your surgeon and anesthesiologist your surgery may need to be rescheduled.     _X__ 1. Do not eat food after midnight the night before your procedure.                 No gum chewing or hard candies. You may drink clear liquids up to 2 hours                 before you are scheduled to arrive for your surgery-                  Diabetics water only  __X__2.  On the morning of surgery brush your teeth with toothpaste and water, you                 may rinse your mouth with mouthwash if you wish.  Do not swallow any              toothpaste of mouthwash.     _X__ 3.  No Alcohol for 24 hours before or after surgery.   _X__ 4.  Do Not Smoke or use e-cigarettes For 24 Hours Prior to Your Surgery.                 Do not use any chewable tobacco products for at least 6 hours prior to                 surgery.  ____  5.  Bring all medications with you on the day of surgery if instructed.   __X__  6.  Notify your doctor if there is any change in your medical condition      (cold, fever, infections).     Do not wear jewelry, make-up, hairpins, clips or nail polish. Do not wear lotions, powders, or perfumes.  Do not shave 48 hours prior to surgery. Men may shave face and neck. Do not bring valuables to the hospital.    [facility] is not responsible for any belongings or valuables.  Contacts, dentures/partials or body piercings may not be worn into surgery. Bring a case for your contacts, glasses or hearing aids, a denture cup will be supplied. Leave your suitcase in the car. After surgery it may be brought to your room. For patients admitted to the  hospital, discharge time is determined by your treatment team.   Patients discharged the day of surgery will not be allowed to drive home.   Please read over the following fact sheets that you were given:     __X__ Take these medicines the morning of surgery with A SIP OF WATER:    1. none  2.   3.   4.  5.  6.  ____ Fleet Enema (as directed)   ____ Use CHG Soap/SAGE wipes as directed  ____ Use inhalers on the day of surgery  ____ Stop metformin/Janumet/Farxiga 2 days prior to surgery    ____ Take 1/2 of usual insulin dose the night before surgery. No insulin the morning          of surgery.   ____ Stop Blood Thinners Coumadin/Plavix/Xarelto/Pleta/Pradaxa/Eliquis/Effient/Aspirin  on   Or contact your  Surgeon, Cardiologist or Medical Doctor regarding  ability to stop your blood thinners  __X__ Stop Anti-inflammatories 7 days before surgery such as Advil, Ibuprofen, Motrin,  BC or Goodies Powder, Naprosyn, Naproxen, Aleve, Aspirin    __X__ Stop all herbal supplements, fish oil or vitamin E until after surgery. (Omega 3 Fish oil, Melatonin)   ____ Bring C-Pap to the hospital.     Contact your Endocrinologist regarding management of your Insulin Pump before, during and after surgery.

## 2020-11-03 NOTE — H&P (Signed)
Michelle [name] is a 30 y.o. female presenting with Pre Op Consulting (surgical consult for iud removal/replacement in OR, needs to schedule)  History of Present Illness: Patient presents for a pre-operative visit for insertion and replacement of Mirena IUD under anesthesia. Procedure indications: endometriosis dx by lap ovarian cystectomy, chronic pelvic pain, menorrhagia, IUD replacement due, hx of very painful IUD insertion.  Workup:  Pap: 04/2019 neg/neg TVUS: 01/2017 at [facility]:Ut7.1 x 4.1 x 5.2 cm.,Echo texture is homogenous without evidence of focal masses. There is fluid seen within the cervix. Endometrium=6.3 mm. RO:3.3 x 1.6 x 2.1 cm.& normal.LO: 9.5 x 6 x 5.3 cm.Large simple cyst  measuring 5.7 x 3.8 x 6.2 cm. Blood flow is seen in the left ovary using color doppler. Survey of the adnexa demonstrates no adnexal masses.  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  Past Surgical History:  has a past surgical history that includes No past surgeries; Laparoscopic endometriosis fulguration (04/2017); repair malunion metatarsal bones; and Laparoscopic ovarian cystectomy. Family History: family history includes Dementia in her maternal grandfather; Diabetes in her brother, maternal grandmother, and mother; Heart failure in her maternal grandmother; High blood pressure (Hypertension) in her maternal grandmother; Hypothyroidism in her mother; No Known Problems in her paternal grandmother. Social History:  reports  that she has never smoked. She has never used smokeless tobacco. She reports that she does not drink alcohol and does not use drugs. OB/GYN History:          OB History    Gravida  0   Para  0   Term  0   Preterm  0   AB  0   Living  0     SAB  0   IAB  0   Ectopic  0   Molar  0   Multiple  0   Live Births  0        Allergies: is allergic to zoloft [sertraline]. Medications:  Current Outpatient Medications:  .  *naphazoline hcl/pheniramine maleate ophthalmic, , Disp: , Rfl:  .  blood glucose diagnostic, disc test strip, Bayer Contour Next Test Strips. Use 9 times daily to check blood sugar. 250.01, Disp: 800 each, Rfl: 4 .  blood-glucose meter,continuous (DEXCOM G6 RECEIVER) Misc, Use   , Disp: , Rfl:  .  CETIRIZINE HCL (ZYRTEC ORAL), Take by mouth once daily  , Disp: , Rfl:  .  CONTOUR NEXT TEST STRIPS test strip, CHECK BLOOD SUGAR 9 TIMES  DAILY, Disp: 900 strip, Rfl: 3 .  gabapentin (NEURONTIN) 300 MG capsule, Take 1 capsule (300 mg total) by mouth 3 (three) times daily, Disp: 90 capsule, Rfl: 11 .  insulin GLARGINE (LANTUS SOLOSTAR) pen injector (concentration 100 units/mL), 18 units daily in case of pump failure.  Please put on hold for now.  If patient needs it, fill it and use either lantus or basaglar, whichever is preferred., Disp: 15 mL, Rfl: 1 .  insulin LISPRO (HUMALOG U-100 INSULIN) injection (concentration 100 units/mL), INJECT SUBCUTANEOUSLY UP TO 100 UNITS DAILY IN INSULIN  PUMP, Disp: 90 mL, Rfl: 3 .  insulin pump syringe (  PARADIGM RESERVOIR) 1.8 mL Misc, Change site every 2 days; pt seen 12/17, Disp: 45 each, Rfl: 3 .  insulin pump syringe (PARADIGM RESERVOIR) 3 mL Misc, Change site every 2 days; pt seen 12/17, Disp: 45 each, Rfl: 3 .  lancets, Use as instructed 9 TIMES DAILY., Disp: 2700 each, Rfl: 3 .  levonorgestreL (MIRENA 52 MG) 20 mcg/24 hr (5 years) IUD, Insert 1 each into the uterus once Follow package directions., Disp: , Rfl:  .   MELATONIN ORAL, Take 1 tablet by mouth daily., Disp: , Rfl:  .  QUICK-SET PARADIGM ISet, Change pump every 2 days; pt seen 07/09/2011, Disp: 45 each, Rfl: 3 .  UNABLE TO FIND, Med Name: IV prep for insulin infusion pump insertion; patient seen 12/17, Disp: 45 each, Rfl: 3 .  blood glucose meter kit, Use as directed, Disp: 1 each, Rfl: 0 .  elagolix 200 mg Tab, Take 200 mg by mouth 2 (two) times daily (Patient not taking: Reported on 06/23/2020  ), Disp: 56 tablet, Rfl: 11 .  meloxicam (MOBIC) 7.5 MG tablet, Take 1 tablet (7.5 mg total) by mouth once daily (Patient not taking: Reported on 10/19/2020  ), Disp: 30 tablet, Rfl: 0 .  norethindrone (AYGESTIN) 5 mg tablet, Take 1 tablet (5 mg total) by mouth once daily (Patient not taking: Reported on 06/23/2020  ), Disp: 30 tablet, Rfl: 11  Review of Systems: No SOB, no palpitations or chest pain, no new lower extremity edema, no nausea or vomiting or bowel or bladder complaints. See HPI for gyn specific ROS.   Exam:   BP 115/79   Pulse 90   Ht 177.8 cm (5' 10" )   Wt 64 kg (141 lb 3.2 oz)   BMI 20.26 kg/m   Constitutional:  General appearance: Well nourished, well developed female in no acute distress.  Neuro/psych:  Normal mood and affect. No gross motor deficits. Neck:  Supple, normal appearance.  Respiratory:  Normal respiratory effort, no use of accessory muscles Skin:  No visible rashes or external lesions  Impression:   The primary encounter diagnosis was Preoperative clearance. A diagnosis of IUD (intrauterine device) in place was also pertinent to this visit.  Plan:   -Counseling on Gabapentin interactions, side effects provided.  1. IUD Replacement  -Patient returns for a preoperative discussion regarding her plans to proceed with surgical treatment of her Contraception by IUD Replacement procedure. The patient and I discussed the technical aspects of the procedure including the potential for risks and complications.  These include but are not limited to the risk of infection requiring post-operative antibiotics or further procedures. We talked about the risk of injury to adjacent organs including bladder, bowel, ureter, blood vessels or nerves. We talked about the need to convert to an open incision. We talked about the possible need for blood transfusion. We talked aboutpostop complications such asthromboembolic or cardiopulmonary complications. All of her questions were answered.  Her preoperative exam was completed and the appropriate consents were signed.   Specific Peri-operative Considerations:  - Consent: obtained today - Health Maintenance: up to date - Labs: CBC, CMP preoperatively - Studies: EKG, CXR preoperatively per insurance - Bowel Preparation: None required - Abx:  None  - VTE ppx: SCDs perioperatively - Glucose Protocol: n/a (hx of Type 1) - Beta-blockade: n/a

## 2020-11-09 ENCOUNTER — Other Ambulatory Visit: Payer: BC Managed Care – PPO

## 2020-11-11 ENCOUNTER — Ambulatory Visit
Admission: RE | Admit: 2020-11-11 | Payer: BC Managed Care – PPO | Source: Ambulatory Visit | Admitting: Obstetrics and Gynecology

## 2020-11-11 ENCOUNTER — Encounter: Admission: RE | Payer: Self-pay | Source: Ambulatory Visit

## 2020-11-11 SURGERY — REMOVAL, INTRAUTERINE DEVICE
Anesthesia: Choice

## 2021-02-16 DIAGNOSIS — N938 Other specified abnormal uterine and vaginal bleeding: Secondary | ICD-10-CM | POA: Diagnosis not present

## 2021-02-16 DIAGNOSIS — R102 Pelvic and perineal pain: Secondary | ICD-10-CM | POA: Diagnosis not present

## 2021-02-16 DIAGNOSIS — Z30431 Encounter for routine checking of intrauterine contraceptive device: Secondary | ICD-10-CM | POA: Diagnosis not present

## 2021-02-20 DIAGNOSIS — Z30431 Encounter for routine checking of intrauterine contraceptive device: Secondary | ICD-10-CM | POA: Diagnosis not present

## 2021-02-20 DIAGNOSIS — R102 Pelvic and perineal pain: Secondary | ICD-10-CM | POA: Diagnosis not present

## 2021-02-20 DIAGNOSIS — N83202 Unspecified ovarian cyst, left side: Secondary | ICD-10-CM | POA: Diagnosis not present

## 2021-03-02 DIAGNOSIS — E108 Type 1 diabetes mellitus with unspecified complications: Secondary | ICD-10-CM | POA: Diagnosis not present

## 2021-03-02 DIAGNOSIS — E109 Type 1 diabetes mellitus without complications: Secondary | ICD-10-CM | POA: Diagnosis not present

## 2021-03-02 DIAGNOSIS — Z794 Long term (current) use of insulin: Secondary | ICD-10-CM | POA: Diagnosis not present

## 2021-03-30 DIAGNOSIS — E109 Type 1 diabetes mellitus without complications: Secondary | ICD-10-CM | POA: Diagnosis not present

## 2021-03-30 DIAGNOSIS — E108 Type 1 diabetes mellitus with unspecified complications: Secondary | ICD-10-CM | POA: Diagnosis not present

## 2021-03-30 DIAGNOSIS — Z794 Long term (current) use of insulin: Secondary | ICD-10-CM | POA: Diagnosis not present

## 2021-03-31 DIAGNOSIS — E108 Type 1 diabetes mellitus with unspecified complications: Secondary | ICD-10-CM | POA: Diagnosis not present

## 2021-03-31 DIAGNOSIS — E109 Type 1 diabetes mellitus without complications: Secondary | ICD-10-CM | POA: Diagnosis not present

## 2021-03-31 DIAGNOSIS — Z794 Long term (current) use of insulin: Secondary | ICD-10-CM | POA: Diagnosis not present

## 2021-04-02 DIAGNOSIS — E108 Type 1 diabetes mellitus with unspecified complications: Secondary | ICD-10-CM | POA: Diagnosis not present

## 2021-04-02 DIAGNOSIS — Z794 Long term (current) use of insulin: Secondary | ICD-10-CM | POA: Diagnosis not present

## 2021-04-02 DIAGNOSIS — E109 Type 1 diabetes mellitus without complications: Secondary | ICD-10-CM | POA: Diagnosis not present

## 2021-04-04 DIAGNOSIS — Z794 Long term (current) use of insulin: Secondary | ICD-10-CM | POA: Diagnosis not present

## 2021-04-04 DIAGNOSIS — E109 Type 1 diabetes mellitus without complications: Secondary | ICD-10-CM | POA: Diagnosis not present

## 2021-04-04 DIAGNOSIS — E108 Type 1 diabetes mellitus with unspecified complications: Secondary | ICD-10-CM | POA: Diagnosis not present

## 2021-05-02 DIAGNOSIS — E108 Type 1 diabetes mellitus with unspecified complications: Secondary | ICD-10-CM | POA: Diagnosis not present

## 2021-05-02 DIAGNOSIS — E109 Type 1 diabetes mellitus without complications: Secondary | ICD-10-CM | POA: Diagnosis not present

## 2021-05-02 DIAGNOSIS — Z794 Long term (current) use of insulin: Secondary | ICD-10-CM | POA: Diagnosis not present

## 2021-06-05 DIAGNOSIS — E108 Type 1 diabetes mellitus with unspecified complications: Secondary | ICD-10-CM | POA: Diagnosis not present

## 2021-06-05 DIAGNOSIS — Z794 Long term (current) use of insulin: Secondary | ICD-10-CM | POA: Diagnosis not present

## 2021-06-05 DIAGNOSIS — E109 Type 1 diabetes mellitus without complications: Secondary | ICD-10-CM | POA: Diagnosis not present

## 2021-06-24 DIAGNOSIS — E109 Type 1 diabetes mellitus without complications: Secondary | ICD-10-CM | POA: Diagnosis not present

## 2021-06-24 DIAGNOSIS — E108 Type 1 diabetes mellitus with unspecified complications: Secondary | ICD-10-CM | POA: Diagnosis not present

## 2021-06-24 DIAGNOSIS — Z794 Long term (current) use of insulin: Secondary | ICD-10-CM | POA: Diagnosis not present

## 2021-07-05 ENCOUNTER — Other Ambulatory Visit (HOSPITAL_COMMUNITY): Payer: Self-pay

## 2021-07-05 DIAGNOSIS — E109 Type 1 diabetes mellitus without complications: Secondary | ICD-10-CM | POA: Diagnosis not present

## 2021-07-05 DIAGNOSIS — R5383 Other fatigue: Secondary | ICD-10-CM | POA: Diagnosis not present

## 2021-07-05 DIAGNOSIS — E039 Hypothyroidism, unspecified: Secondary | ICD-10-CM | POA: Diagnosis not present

## 2021-07-05 DIAGNOSIS — Z794 Long term (current) use of insulin: Secondary | ICD-10-CM | POA: Diagnosis not present

## 2021-07-05 DIAGNOSIS — E108 Type 1 diabetes mellitus with unspecified complications: Secondary | ICD-10-CM | POA: Diagnosis not present

## 2021-08-05 DIAGNOSIS — E108 Type 1 diabetes mellitus with unspecified complications: Secondary | ICD-10-CM | POA: Diagnosis not present

## 2021-08-05 DIAGNOSIS — E109 Type 1 diabetes mellitus without complications: Secondary | ICD-10-CM | POA: Diagnosis not present

## 2021-08-05 DIAGNOSIS — Z794 Long term (current) use of insulin: Secondary | ICD-10-CM | POA: Diagnosis not present

## 2021-08-23 DIAGNOSIS — R102 Pelvic and perineal pain: Secondary | ICD-10-CM | POA: Diagnosis not present

## 2021-09-06 DIAGNOSIS — E109 Type 1 diabetes mellitus without complications: Secondary | ICD-10-CM | POA: Diagnosis not present

## 2021-09-06 DIAGNOSIS — E108 Type 1 diabetes mellitus with unspecified complications: Secondary | ICD-10-CM | POA: Diagnosis not present

## 2021-09-06 DIAGNOSIS — Z794 Long term (current) use of insulin: Secondary | ICD-10-CM | POA: Diagnosis not present

## 2021-09-12 DIAGNOSIS — Z794 Long term (current) use of insulin: Secondary | ICD-10-CM | POA: Diagnosis not present

## 2021-09-12 DIAGNOSIS — E109 Type 1 diabetes mellitus without complications: Secondary | ICD-10-CM | POA: Diagnosis not present

## 2021-09-12 DIAGNOSIS — E108 Type 1 diabetes mellitus with unspecified complications: Secondary | ICD-10-CM | POA: Diagnosis not present

## 2021-09-18 ENCOUNTER — Other Ambulatory Visit (HOSPITAL_COMMUNITY): Payer: Self-pay

## 2021-09-18 MED ORDER — GABAPENTIN 300 MG PO CAPS
ORAL_CAPSULE | ORAL | 3 refills | Status: DC
  Filled 2021-09-18: qty 90, 30d supply, fill #0
  Filled 2021-12-02: qty 90, 30d supply, fill #1

## 2021-10-06 DIAGNOSIS — E109 Type 1 diabetes mellitus without complications: Secondary | ICD-10-CM | POA: Diagnosis not present

## 2021-10-06 DIAGNOSIS — Z794 Long term (current) use of insulin: Secondary | ICD-10-CM | POA: Diagnosis not present

## 2021-10-06 DIAGNOSIS — E108 Type 1 diabetes mellitus with unspecified complications: Secondary | ICD-10-CM | POA: Diagnosis not present

## 2021-11-05 DIAGNOSIS — Z794 Long term (current) use of insulin: Secondary | ICD-10-CM | POA: Diagnosis not present

## 2021-11-05 DIAGNOSIS — E108 Type 1 diabetes mellitus with unspecified complications: Secondary | ICD-10-CM | POA: Diagnosis not present

## 2021-11-05 DIAGNOSIS — E109 Type 1 diabetes mellitus without complications: Secondary | ICD-10-CM | POA: Diagnosis not present

## 2021-11-13 IMAGING — DX DG WRIST COMPLETE 3+V*R*
4 series · 4 of 4 positions shown · non-contrast
Comparison: None.

CLINICAL DATA: 29-year-old female with right wrist pain.

EXAM:
RIGHT WRIST - COMPLETE 3+ VIEW

[wrist ap (1 of 2)]
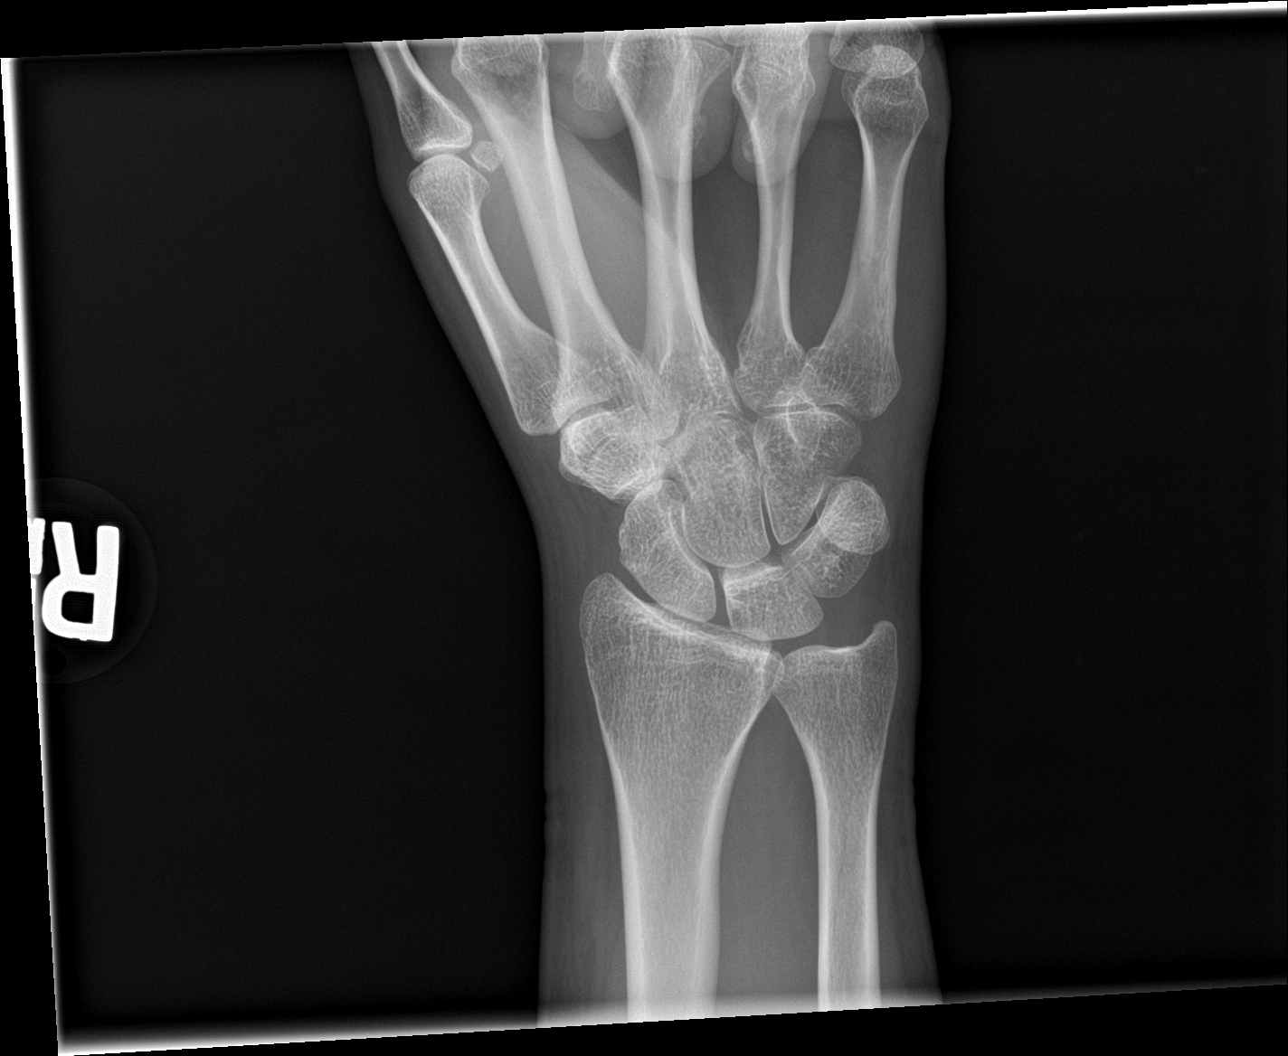

[wrist obl]
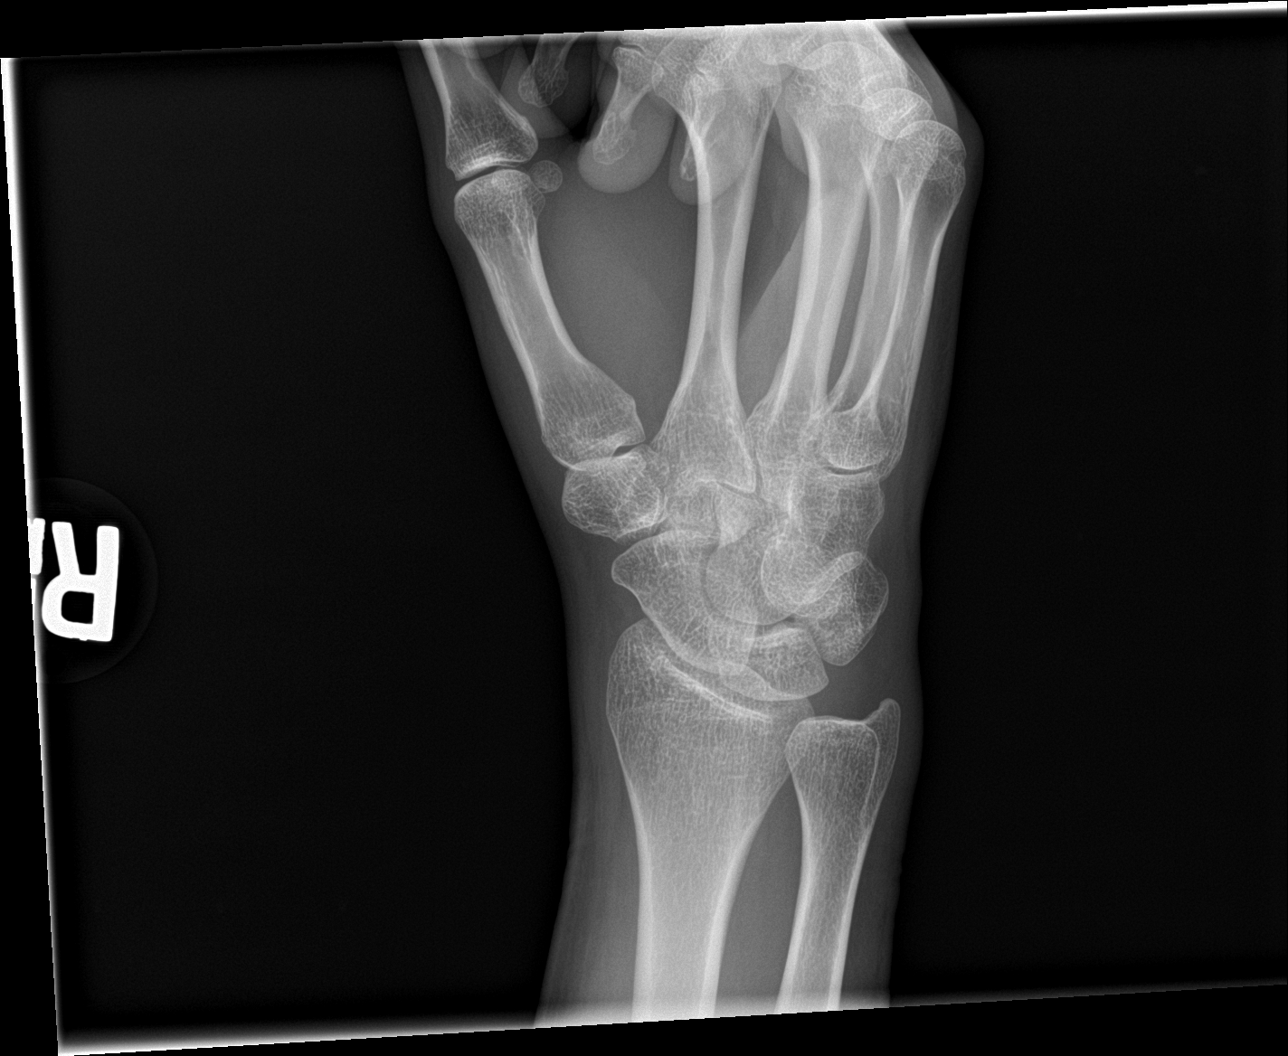

[wrist lat]
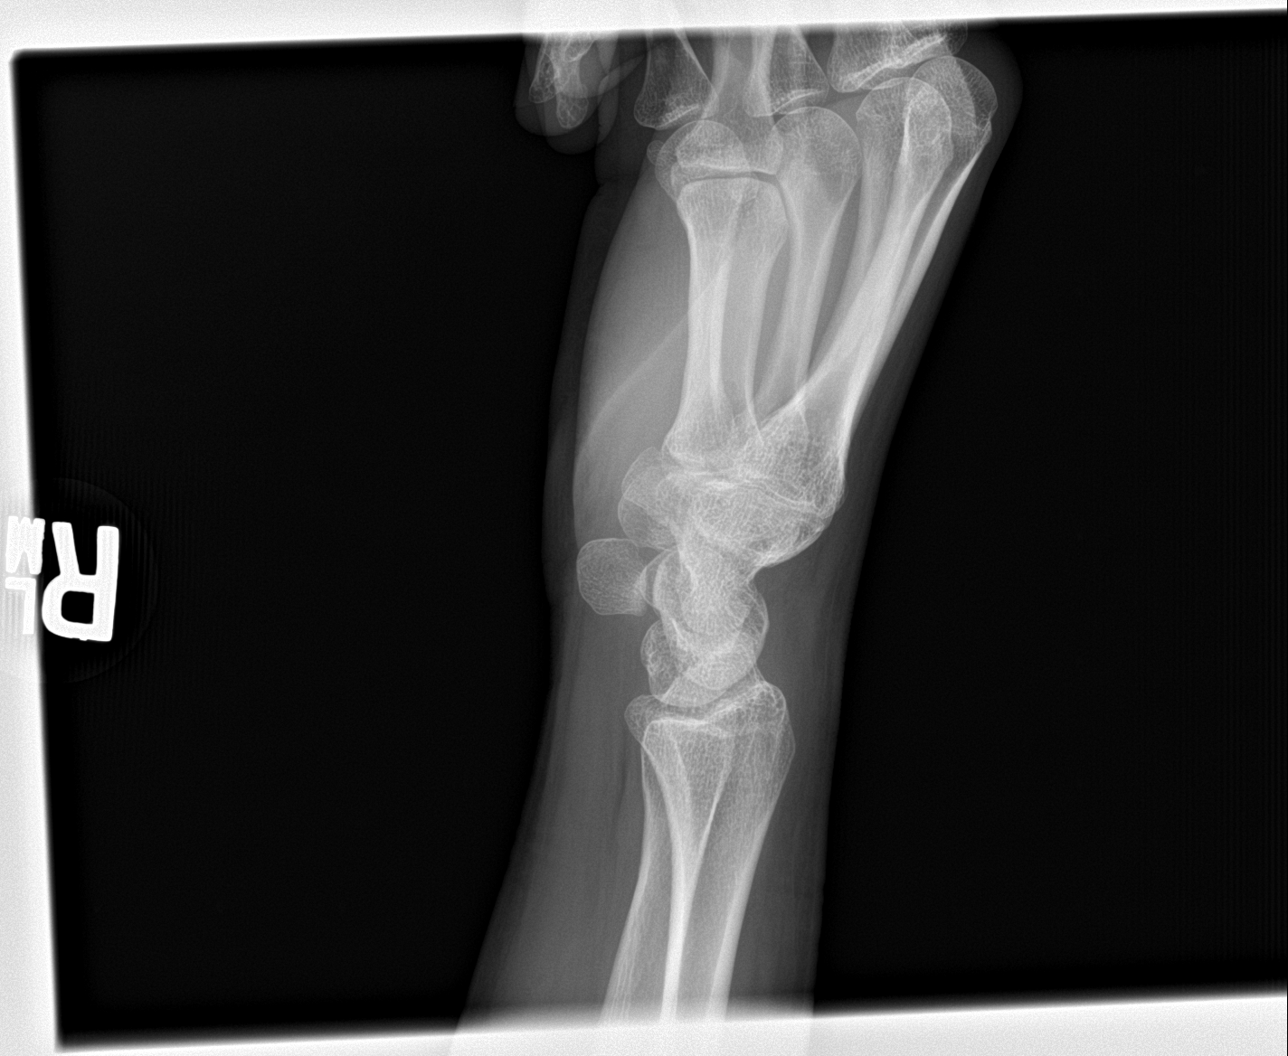

[wrist ap (2 of 2)]
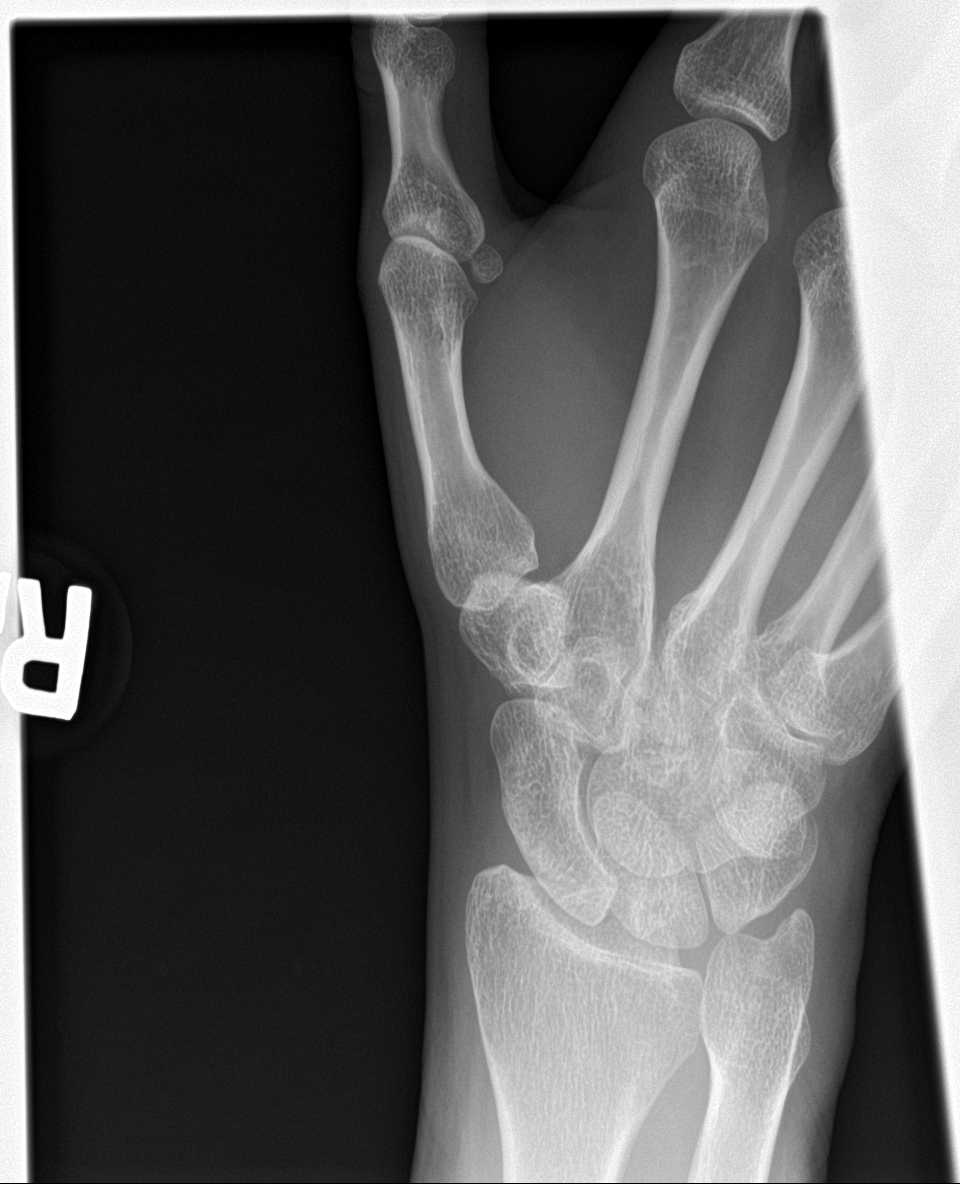

[4 of 4 positions shown; findings below may reference images not displayed]

FINDINGS: There is no acute fracture or dislocation. The bones are well
mineralized. No arthritic changes. The soft tissues are
unremarkable.
IMPRESSION: Negative.

## 2021-12-01 DIAGNOSIS — Z794 Long term (current) use of insulin: Secondary | ICD-10-CM | POA: Diagnosis not present

## 2021-12-01 DIAGNOSIS — E108 Type 1 diabetes mellitus with unspecified complications: Secondary | ICD-10-CM | POA: Diagnosis not present

## 2021-12-01 DIAGNOSIS — E109 Type 1 diabetes mellitus without complications: Secondary | ICD-10-CM | POA: Diagnosis not present

## 2021-12-04 ENCOUNTER — Other Ambulatory Visit (HOSPITAL_COMMUNITY): Payer: Self-pay

## 2021-12-27 DIAGNOSIS — Z794 Long term (current) use of insulin: Secondary | ICD-10-CM | POA: Diagnosis not present

## 2021-12-27 DIAGNOSIS — E108 Type 1 diabetes mellitus with unspecified complications: Secondary | ICD-10-CM | POA: Diagnosis not present

## 2021-12-27 DIAGNOSIS — E109 Type 1 diabetes mellitus without complications: Secondary | ICD-10-CM | POA: Diagnosis not present

## 2022-01-04 ENCOUNTER — Other Ambulatory Visit (HOSPITAL_COMMUNITY): Payer: Self-pay

## 2022-01-04 DIAGNOSIS — E109 Type 1 diabetes mellitus without complications: Secondary | ICD-10-CM | POA: Diagnosis not present

## 2022-01-04 DIAGNOSIS — E039 Hypothyroidism, unspecified: Secondary | ICD-10-CM | POA: Diagnosis not present

## 2022-01-04 MED ORDER — HUMALOG 100 UNIT/ML ~~LOC~~ SOCT
SUBCUTANEOUS | 4 refills | Status: DC
  Filled 2022-01-04: qty 90, 90d supply, fill #0

## 2022-01-08 ENCOUNTER — Other Ambulatory Visit (HOSPITAL_COMMUNITY): Payer: Self-pay

## 2022-01-11 ENCOUNTER — Other Ambulatory Visit (HOSPITAL_COMMUNITY): Payer: Self-pay

## 2022-01-11 MED ORDER — INSULIN LISPRO 100 UNIT/ML IJ SOLN
INTRAMUSCULAR | 4 refills | Status: DC
  Filled 2022-01-11: qty 90, 90d supply, fill #0
  Filled 2022-04-06: qty 90, 90d supply, fill #1
  Filled 2022-06-29: qty 90, 90d supply, fill #2

## 2022-01-26 DIAGNOSIS — Z794 Long term (current) use of insulin: Secondary | ICD-10-CM | POA: Diagnosis not present

## 2022-01-26 DIAGNOSIS — E109 Type 1 diabetes mellitus without complications: Secondary | ICD-10-CM | POA: Diagnosis not present

## 2022-01-26 DIAGNOSIS — E108 Type 1 diabetes mellitus with unspecified complications: Secondary | ICD-10-CM | POA: Diagnosis not present

## 2022-01-29 ENCOUNTER — Other Ambulatory Visit (HOSPITAL_COMMUNITY): Payer: Self-pay

## 2022-02-02 DIAGNOSIS — Z01411 Encounter for gynecological examination (general) (routine) with abnormal findings: Secondary | ICD-10-CM | POA: Diagnosis not present

## 2022-02-02 DIAGNOSIS — N6311 Unspecified lump in the right breast, upper outer quadrant: Secondary | ICD-10-CM | POA: Diagnosis not present

## 2022-02-02 DIAGNOSIS — Z1273 Encounter for screening for malignant neoplasm of ovary: Secondary | ICD-10-CM | POA: Diagnosis not present

## 2022-02-02 DIAGNOSIS — Z124 Encounter for screening for malignant neoplasm of cervix: Secondary | ICD-10-CM | POA: Diagnosis not present

## 2022-02-02 LAB — RESULTS CONSOLE HPV: CHL HPV: NEGATIVE

## 2022-02-02 LAB — HM PAP SMEAR: HM Pap smear: NORMAL

## 2022-02-06 ENCOUNTER — Other Ambulatory Visit: Payer: Self-pay | Admitting: Certified Nurse Midwife

## 2022-02-06 DIAGNOSIS — N6311 Unspecified lump in the right breast, upper outer quadrant: Secondary | ICD-10-CM

## 2022-02-20 DIAGNOSIS — Z794 Long term (current) use of insulin: Secondary | ICD-10-CM | POA: Diagnosis not present

## 2022-02-20 DIAGNOSIS — E109 Type 1 diabetes mellitus without complications: Secondary | ICD-10-CM | POA: Diagnosis not present

## 2022-02-20 DIAGNOSIS — E108 Type 1 diabetes mellitus with unspecified complications: Secondary | ICD-10-CM | POA: Diagnosis not present

## 2022-02-23 ENCOUNTER — Ambulatory Visit
Admission: RE | Admit: 2022-02-23 | Discharge: 2022-02-23 | Disposition: A | Discharge status: Home / Self Care | Payer: 59 | Source: Ambulatory Visit | Attending: Certified Nurse Midwife | Admitting: Certified Nurse Midwife

## 2022-02-23 ENCOUNTER — Encounter: Payer: Self-pay | Admitting: Radiology

## 2022-02-23 DIAGNOSIS — N6311 Unspecified lump in the right breast, upper outer quadrant: Secondary | ICD-10-CM

## 2022-02-23 DIAGNOSIS — R921 Mammographic calcification found on diagnostic imaging of breast: Secondary | ICD-10-CM | POA: Diagnosis not present

## 2022-02-25 DIAGNOSIS — Z794 Long term (current) use of insulin: Secondary | ICD-10-CM | POA: Diagnosis not present

## 2022-02-25 DIAGNOSIS — E108 Type 1 diabetes mellitus with unspecified complications: Secondary | ICD-10-CM | POA: Diagnosis not present

## 2022-02-25 DIAGNOSIS — E109 Type 1 diabetes mellitus without complications: Secondary | ICD-10-CM | POA: Diagnosis not present

## 2022-03-27 DIAGNOSIS — E109 Type 1 diabetes mellitus without complications: Secondary | ICD-10-CM | POA: Diagnosis not present

## 2022-03-27 DIAGNOSIS — E108 Type 1 diabetes mellitus with unspecified complications: Secondary | ICD-10-CM | POA: Diagnosis not present

## 2022-03-27 DIAGNOSIS — Z794 Long term (current) use of insulin: Secondary | ICD-10-CM | POA: Diagnosis not present

## 2022-04-06 ENCOUNTER — Other Ambulatory Visit (HOSPITAL_COMMUNITY): Payer: Self-pay

## 2022-04-06 DIAGNOSIS — H0014 Chalazion left upper eyelid: Secondary | ICD-10-CM | POA: Diagnosis not present

## 2022-04-26 DIAGNOSIS — Z794 Long term (current) use of insulin: Secondary | ICD-10-CM | POA: Diagnosis not present

## 2022-04-26 DIAGNOSIS — E109 Type 1 diabetes mellitus without complications: Secondary | ICD-10-CM | POA: Diagnosis not present

## 2022-04-26 DIAGNOSIS — E108 Type 1 diabetes mellitus with unspecified complications: Secondary | ICD-10-CM | POA: Diagnosis not present

## 2022-05-08 ENCOUNTER — Other Ambulatory Visit: Payer: Self-pay

## 2022-05-26 DIAGNOSIS — E108 Type 1 diabetes mellitus with unspecified complications: Secondary | ICD-10-CM | POA: Diagnosis not present

## 2022-05-26 DIAGNOSIS — E109 Type 1 diabetes mellitus without complications: Secondary | ICD-10-CM | POA: Diagnosis not present

## 2022-05-26 DIAGNOSIS — Z794 Long term (current) use of insulin: Secondary | ICD-10-CM | POA: Diagnosis not present

## 2022-05-30 DIAGNOSIS — E109 Type 1 diabetes mellitus without complications: Secondary | ICD-10-CM | POA: Diagnosis not present

## 2022-05-30 DIAGNOSIS — Z794 Long term (current) use of insulin: Secondary | ICD-10-CM | POA: Diagnosis not present

## 2022-05-30 DIAGNOSIS — E108 Type 1 diabetes mellitus with unspecified complications: Secondary | ICD-10-CM | POA: Diagnosis not present

## 2022-06-30 ENCOUNTER — Other Ambulatory Visit (HOSPITAL_COMMUNITY): Payer: Self-pay

## 2022-07-02 DIAGNOSIS — E109 Type 1 diabetes mellitus without complications: Secondary | ICD-10-CM | POA: Diagnosis not present

## 2022-07-02 DIAGNOSIS — E108 Type 1 diabetes mellitus with unspecified complications: Secondary | ICD-10-CM | POA: Diagnosis not present

## 2022-07-02 DIAGNOSIS — Z794 Long term (current) use of insulin: Secondary | ICD-10-CM | POA: Diagnosis not present

## 2022-07-05 DIAGNOSIS — E039 Hypothyroidism, unspecified: Secondary | ICD-10-CM | POA: Diagnosis not present

## 2022-07-05 DIAGNOSIS — E109 Type 1 diabetes mellitus without complications: Secondary | ICD-10-CM | POA: Diagnosis not present

## 2022-07-05 LAB — COMPREHENSIVE METABOLIC PANEL: eGFR: 119

## 2022-07-05 LAB — PROTEIN / CREATININE RATIO, URINE
Albumin, U: 9
Creatinine, Urine: 181.8

## 2022-07-05 LAB — MICROALBUMIN / CREATININE URINE RATIO: Microalb Creat Ratio: 5

## 2022-08-01 DIAGNOSIS — E108 Type 1 diabetes mellitus with unspecified complications: Secondary | ICD-10-CM | POA: Diagnosis not present

## 2022-08-01 DIAGNOSIS — Z794 Long term (current) use of insulin: Secondary | ICD-10-CM | POA: Diagnosis not present

## 2022-08-01 DIAGNOSIS — E109 Type 1 diabetes mellitus without complications: Secondary | ICD-10-CM | POA: Diagnosis not present

## 2022-08-07 ENCOUNTER — Other Ambulatory Visit: Payer: Self-pay | Admitting: Certified Nurse Midwife

## 2022-08-07 DIAGNOSIS — R921 Mammographic calcification found on diagnostic imaging of breast: Secondary | ICD-10-CM

## 2022-08-28 DIAGNOSIS — Z794 Long term (current) use of insulin: Secondary | ICD-10-CM | POA: Diagnosis not present

## 2022-08-28 DIAGNOSIS — E109 Type 1 diabetes mellitus without complications: Secondary | ICD-10-CM | POA: Diagnosis not present

## 2022-08-28 DIAGNOSIS — E108 Type 1 diabetes mellitus with unspecified complications: Secondary | ICD-10-CM | POA: Diagnosis not present

## 2022-08-31 DIAGNOSIS — E109 Type 1 diabetes mellitus without complications: Secondary | ICD-10-CM | POA: Diagnosis not present

## 2022-08-31 DIAGNOSIS — Z794 Long term (current) use of insulin: Secondary | ICD-10-CM | POA: Diagnosis not present

## 2022-08-31 DIAGNOSIS — E108 Type 1 diabetes mellitus with unspecified complications: Secondary | ICD-10-CM | POA: Diagnosis not present

## 2022-09-05 ENCOUNTER — Ambulatory Visit
Admission: RE | Admit: 2022-09-05 | Discharge: 2022-09-05 | Disposition: A | Discharge status: Home / Self Care | Payer: Commercial Managed Care - PPO | Source: Ambulatory Visit | Attending: Certified Nurse Midwife | Admitting: Certified Nurse Midwife

## 2022-09-05 DIAGNOSIS — R921 Mammographic calcification found on diagnostic imaging of breast: Secondary | ICD-10-CM | POA: Diagnosis not present

## 2022-09-28 ENCOUNTER — Other Ambulatory Visit (HOSPITAL_COMMUNITY): Payer: Self-pay

## 2022-09-28 ENCOUNTER — Other Ambulatory Visit: Payer: Self-pay

## 2022-09-28 ENCOUNTER — Encounter (HOSPITAL_COMMUNITY): Payer: Self-pay

## 2022-09-28 MED ORDER — DEXCOM G7 SENSOR MISC
11 refills | Status: DC
  Filled 2022-09-28 (×2): qty 3, 30d supply, fill #0
  Filled 2022-09-28: qty 9, 90d supply, fill #0
  Filled 2023-01-05: qty 9, 90d supply, fill #1
  Filled 2023-04-02 – 2023-04-12 (×2): qty 9, 90d supply, fill #2
  Filled 2023-07-06: qty 9, 90d supply, fill #3

## 2022-10-01 ENCOUNTER — Other Ambulatory Visit (HOSPITAL_COMMUNITY): Payer: Self-pay

## 2022-10-01 ENCOUNTER — Other Ambulatory Visit: Payer: Self-pay

## 2022-10-18 ENCOUNTER — Other Ambulatory Visit (HOSPITAL_COMMUNITY): Payer: Self-pay

## 2022-10-18 MED ORDER — ATOVAQUONE-PROGUANIL HCL 250-100 MG PO TABS
1.0000 | ORAL_TABLET | Freq: Every day | ORAL | 0 refills | Status: DC
  Filled 2022-10-18: qty 24, 24d supply, fill #0

## 2022-10-18 MED ORDER — AZITHROMYCIN 500 MG PO TABS
ORAL_TABLET | ORAL | 0 refills | Status: DC
  Filled 2022-10-18: qty 4, 3d supply, fill #0

## 2022-10-29 ENCOUNTER — Other Ambulatory Visit (HOSPITAL_COMMUNITY): Payer: Self-pay

## 2022-10-29 ENCOUNTER — Other Ambulatory Visit: Payer: Self-pay

## 2022-10-29 MED ORDER — INSULIN PEN NEEDLE 32G X 4 MM MISC
4 refills | Status: AC
  Filled 2022-10-29: qty 100, 50d supply, fill #0

## 2022-10-29 MED ORDER — INSULIN GLARGINE SOLOSTAR 100 UNIT/ML ~~LOC~~ SOPN
18.0000 [IU] | PEN_INJECTOR | Freq: Every day | SUBCUTANEOUS | 1 refills | Status: DC
  Filled 2022-10-30 – 2022-11-03 (×2): qty 15, 83d supply, fill #0

## 2022-10-29 MED ORDER — INSULIN LISPRO (1 UNIT DIAL) 100 UNIT/ML (KWIKPEN)
PEN_INJECTOR | SUBCUTANEOUS | 1 refills | Status: AC
  Filled 2022-10-29: qty 90, 90d supply, fill #0

## 2022-10-30 ENCOUNTER — Other Ambulatory Visit (HOSPITAL_COMMUNITY): Payer: Self-pay

## 2022-11-05 ENCOUNTER — Other Ambulatory Visit: Payer: Self-pay

## 2022-11-05 ENCOUNTER — Other Ambulatory Visit (HOSPITAL_COMMUNITY): Payer: Self-pay

## 2022-11-05 ENCOUNTER — Encounter (HOSPITAL_COMMUNITY): Payer: Self-pay

## 2022-11-16 DIAGNOSIS — E109 Type 1 diabetes mellitus without complications: Secondary | ICD-10-CM | POA: Diagnosis not present

## 2022-11-16 DIAGNOSIS — Z794 Long term (current) use of insulin: Secondary | ICD-10-CM | POA: Diagnosis not present

## 2022-11-16 DIAGNOSIS — E108 Type 1 diabetes mellitus with unspecified complications: Secondary | ICD-10-CM | POA: Diagnosis not present

## 2022-12-12 DIAGNOSIS — E039 Hypothyroidism, unspecified: Secondary | ICD-10-CM | POA: Diagnosis not present

## 2022-12-12 DIAGNOSIS — E109 Type 1 diabetes mellitus without complications: Secondary | ICD-10-CM | POA: Diagnosis not present

## 2022-12-12 LAB — HEMOGLOBIN A1C: Hemoglobin A1C: 6.9

## 2023-01-05 ENCOUNTER — Other Ambulatory Visit (HOSPITAL_COMMUNITY): Payer: Self-pay

## 2023-01-07 ENCOUNTER — Other Ambulatory Visit: Payer: Self-pay

## 2023-01-07 ENCOUNTER — Other Ambulatory Visit (HOSPITAL_COMMUNITY): Payer: Self-pay

## 2023-01-09 ENCOUNTER — Other Ambulatory Visit (HOSPITAL_COMMUNITY): Payer: Self-pay

## 2023-01-09 MED ORDER — INSULIN LISPRO 100 UNIT/ML IJ SOLN
INTRAMUSCULAR | 4 refills | Status: DC
  Filled 2023-01-09: qty 90, 90d supply, fill #0
  Filled 2023-12-31: qty 90, 90d supply, fill #1

## 2023-01-10 ENCOUNTER — Other Ambulatory Visit: Payer: Self-pay

## 2023-02-11 ENCOUNTER — Encounter: Payer: Self-pay | Admitting: Certified Nurse Midwife

## 2023-02-12 DIAGNOSIS — E108 Type 1 diabetes mellitus with unspecified complications: Secondary | ICD-10-CM | POA: Diagnosis not present

## 2023-02-12 DIAGNOSIS — E109 Type 1 diabetes mellitus without complications: Secondary | ICD-10-CM | POA: Diagnosis not present

## 2023-02-12 DIAGNOSIS — Z794 Long term (current) use of insulin: Secondary | ICD-10-CM | POA: Diagnosis not present

## 2023-02-14 ENCOUNTER — Other Ambulatory Visit: Payer: Self-pay | Admitting: Certified Nurse Midwife

## 2023-02-14 DIAGNOSIS — R921 Mammographic calcification found on diagnostic imaging of breast: Secondary | ICD-10-CM

## 2023-03-18 ENCOUNTER — Ambulatory Visit
Admission: RE | Admit: 2023-03-18 | Discharge: 2023-03-18 | Disposition: A | Discharge status: Home / Self Care | Payer: Commercial Managed Care - PPO | Source: Ambulatory Visit | Attending: Certified Nurse Midwife | Admitting: Certified Nurse Midwife

## 2023-03-18 DIAGNOSIS — R92343 Mammographic extreme density, bilateral breasts: Secondary | ICD-10-CM | POA: Diagnosis not present

## 2023-03-18 DIAGNOSIS — R928 Other abnormal and inconclusive findings on diagnostic imaging of breast: Secondary | ICD-10-CM | POA: Diagnosis not present

## 2023-03-18 DIAGNOSIS — R921 Mammographic calcification found on diagnostic imaging of breast: Secondary | ICD-10-CM | POA: Diagnosis not present

## 2023-04-02 ENCOUNTER — Other Ambulatory Visit (HOSPITAL_COMMUNITY): Payer: Self-pay

## 2023-04-02 ENCOUNTER — Ambulatory Visit: Payer: Commercial Managed Care - PPO | Admitting: Podiatry

## 2023-04-02 ENCOUNTER — Ambulatory Visit: Payer: Federal, State, Local not specified - PPO | Admitting: Podiatry

## 2023-04-02 DIAGNOSIS — M722 Plantar fascial fibromatosis: Secondary | ICD-10-CM

## 2023-04-02 LAB — HM DIABETES FOOT EXAM

## 2023-04-02 MED ORDER — METHYLPREDNISOLONE 4 MG PO TBPK: ORAL_TABLET | ORAL | 0 refills | Status: DC

## 2023-04-03 NOTE — Progress Notes (Signed)
Subjective:  Patient ID: Michelle [name], female    DOB: [birth year],  MRN: [id number] HPI Chief Complaint  Patient presents with   Plantar Fasciitis    Pt presents for new orthotics.pt stated that she is here to get new orthotics her old ones are worn out.    32 y.o. female presents with the above complaint.   ROS: Denies fever chills nausea vomit muscle aches pains calf pain back pain chest pain shortness of breath.  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  Past Medical History:  Diagnosis Date   Diabetes mellitus without complication (HCC)    Ovarian cyst    PTSD (post-traumatic stress disorder)    Septate uterus    Past Surgical History:  Procedure Laterality Date   FINGER FRACTURE SURGERY Left    pinkie   FULGURATION OF LESION N/A 05/13/2017   Procedure: FULGURATION OF ENDOMETRIOSIS;  Surgeon: Herold Harms, MD;  Location: [facility] ORS;  Service: Gynecology;  Laterality: N/A;   LAPAROSCOPIC OVARIAN CYSTECTOMY Left 05/13/2017   Procedure: LAPAROSCOPIC LEFT OVARIAN CYSTECTOMY WITH PERITONEAL BIOPSIE;  Surgeon: Herold Harms, MD;  Location: [facility] ORS;  Service: Gynecology;  Laterality: Left;   NO PAST SURGERIES      Current Outpatient Medications:    methylPREDNISolone (MEDROL DOSEPAK) 4 MG TBPK tablet, 6 day dose pack - take as directed, Disp: 21 tablet, Rfl: 0   atovaquone-proguanil (MALARONE) 250-100 MG TABS tablet, Take 1 tablet by mouth daily. Begin 2 days prearrival, take daily during stay, and continue for 7 days posttravel for malaria prevention, Disp: 24 tablet, Rfl: 0   azithromycin (ZITHROMAX) 500 MG tablet, For NONBLOODY diarrhea, take 2 pills on day 1. If resolved, stop medication. If diarrhea persists, take 1 pill on day 2 and 3. For BLOODY diarrhea, take 2 pills on day 1 and 1 pill on day 2 and 3., Disp: 4 tablet, Rfl: 0   cetirizine (ZYRTEC) 10 MG tablet, Take 10 mg by mouth daily., Disp: , Rfl:    Continuous Glucose Sensor (DEXCOM G7 SENSOR) MISC, Use 1 Device  every 10 (ten) days, Disp: 3 each, Rfl: 11   gabapentin (NEURONTIN) 300 MG capsule, Take 600 mg by mouth at bedtime., Disp: , Rfl:    gabapentin (NEURONTIN) 300 MG capsule, Take 1 capsule by mouth three times daily, Disp: 90 capsule, Rfl: 3   HUMALOG 100 UNIT/ML injection, Inject 100 Units into the skin continuous. Via pump, 100 units per day, Disp: , Rfl:    Insulin Glargine Solostar (LANTUS) 100 UNIT/ML Solostar Pen, Inject 18 Units into the skin daily., Disp: 15 mL, Rfl: 1   insulin lispro (HUMALOG) 100 UNIT/ML cartridge, Inject up to 100 units daily in pump, Disp: 90 mL, Rfl: 4   insulin lispro (HUMALOG KWIKPEN) 100 UNIT/ML KwikPen, Inject up to 100 units daily into the skin, Disp: 90 mL, Rfl: 1   insulin lispro (HUMALOG) 100 UNIT/ML injection, Inject up to 100 units daily into the pump, Disp: 90 mL, Rfl: 4   Insulin Pen Needle 32G X 4 MM MISC, Use twice daily as directed, Disp: 200 each, Rfl: 4   levonorgestrel (MIRENA) 20 MCG/24HR IUD, 1 each by Intrauterine route once., Disp: , Rfl:    Melatonin 10 MG TABS, Take 10 mg by mouth at bedtime as needed (sleep)., Disp: , Rfl:    Omega-3 Fatty Acids (FISH OIL) 1000 MG CAPS, Take 1,000 mg by mouth daily., Disp: , Rfl:   Allergies  Allergen Reactions   Zoloft [  Sertraline Hcl] Itching and Palpitations   Review of Systems Objective:  There were no vitals filed for this visit.  General: Well developed, nourished, in no acute distress, alert and oriented x3   Dermatological: Skin is warm, dry and supple bilateral. Nails x 10 are well maintained; remaining integument appears unremarkable at this time. There are no open sores, no preulcerative lesions, no rash or signs of infection present.  Vascular: Dorsalis Pedis artery and Posterior Tibial artery pedal pulses are 2/4 bilateral with immedate capillary fill time. Pedal hair growth present. No varicosities and no lower extremity edema present bilateral.   Neruologic: Grossly intact via light  touch bilateral. Vibratory intact via tuning fork bilateral. Protective threshold with Semmes Wienstein monofilament intact to all pedal sites bilateral. Patellar and Achilles deep tendon reflexes 2+ bilateral. No Babinski or clonus noted bilateral.   Musculoskeletal: No gross boney pedal deformities bilateral. No pain, crepitus, or limitation noted with foot and ankle range of motion bilateral. Muscular strength 5/5 in all groups tested bilateral.  Tenderness on palpation medial calcaneal tubercles bilateral  Gait: Unassisted, Nonantalgic.    Radiographs:  None taken  Assessment & Plan:   Assessment: Planter fasciitis  Plan: She was scanned for new orthotics.  She will have new orthotics neutral heel.  Full-length for plantar fasciitis.     Kenly Xiao T. Collins, [place]

## 2023-04-04 ENCOUNTER — Other Ambulatory Visit: Payer: Self-pay

## 2023-04-04 ENCOUNTER — Encounter: Payer: Self-pay | Admitting: Pharmacist

## 2023-04-09 ENCOUNTER — Other Ambulatory Visit: Payer: Self-pay

## 2023-04-09 NOTE — Progress Notes (Signed)
Order placed for Custom Foot orthotics today  Addison Bailey Cped, CFo, CFm

## 2023-04-12 ENCOUNTER — Other Ambulatory Visit: Payer: Self-pay

## 2023-04-12 ENCOUNTER — Other Ambulatory Visit (HOSPITAL_COMMUNITY): Payer: Self-pay

## 2023-05-01 ENCOUNTER — Ambulatory Visit: Payer: Commercial Managed Care - PPO

## 2023-05-01 NOTE — Progress Notes (Signed)
Patient presents today to pick up custom molded foot orthotics, diagnosed with PF by Dr. Al Corpus .   Orthotics were dispensed and fit was satisfactory. Reviewed instructions for break-in and wear. Written instructions given to patient.  Patient will follow up as needed.   Addison Bailey Cped, CFo, CFm

## 2023-05-03 DIAGNOSIS — E108 Type 1 diabetes mellitus with unspecified complications: Secondary | ICD-10-CM | POA: Diagnosis not present

## 2023-05-03 DIAGNOSIS — Z794 Long term (current) use of insulin: Secondary | ICD-10-CM | POA: Diagnosis not present

## 2023-05-03 DIAGNOSIS — E109 Type 1 diabetes mellitus without complications: Secondary | ICD-10-CM | POA: Diagnosis not present

## 2023-06-12 ENCOUNTER — Other Ambulatory Visit: Payer: Self-pay | Admitting: Family Medicine

## 2023-06-12 ENCOUNTER — Ambulatory Visit: Payer: Commercial Managed Care - PPO | Admitting: Family Medicine

## 2023-06-12 ENCOUNTER — Encounter: Payer: Self-pay | Admitting: Family Medicine

## 2023-06-12 ENCOUNTER — Ambulatory Visit (HOSPITAL_COMMUNITY)
Admission: RE | Admit: 2023-06-12 | Discharge: 2023-06-12 | Disposition: A | Discharge status: Home / Self Care | Payer: Commercial Managed Care - PPO | Source: Ambulatory Visit | Attending: Family Medicine | Admitting: Family Medicine

## 2023-06-12 ENCOUNTER — Ambulatory Visit (HOSPITAL_COMMUNITY): Payer: Commercial Managed Care - PPO

## 2023-06-12 ENCOUNTER — Other Ambulatory Visit: Payer: Self-pay

## 2023-06-12 VITALS — BP 117/71 | HR 96 | Temp 98.2°F | Resp 18 | Ht 70.0 in | Wt 148.1 lb

## 2023-06-12 DIAGNOSIS — R002 Palpitations: Secondary | ICD-10-CM | POA: Insufficient documentation

## 2023-06-12 DIAGNOSIS — E109 Type 1 diabetes mellitus without complications: Secondary | ICD-10-CM

## 2023-06-12 DIAGNOSIS — Z9641 Presence of insulin pump (external) (internal): Secondary | ICD-10-CM

## 2023-06-12 DIAGNOSIS — Z7689 Persons encountering health services in other specified circumstances: Secondary | ICD-10-CM | POA: Diagnosis not present

## 2023-06-12 DIAGNOSIS — R102 Pelvic and perineal pain: Secondary | ICD-10-CM | POA: Insufficient documentation

## 2023-06-12 DIAGNOSIS — R7989 Other specified abnormal findings of blood chemistry: Secondary | ICD-10-CM | POA: Diagnosis not present

## 2023-06-12 DIAGNOSIS — R0789 Other chest pain: Secondary | ICD-10-CM | POA: Diagnosis not present

## 2023-06-12 NOTE — Progress Notes (Signed)
New Patient Office Visit  Subjective    Patient ID: Michelle [name], female    DOB: 1991/01/18  Age: 32 y.o. MRN: [id number]  CC:  Chief Complaint  Patient presents with   Establish Care    Patient is here to establish care with a new PCP   Palpitations    Patient states that she started having heart palpatations on Sunday when she woke up she states that she could feel her heart racing when she bends down or while walking she also felt flushed and had shortness  of breath and light headedness    HPI Michelle [name] presents to establish care. Pt is new to me.  Chest pain She reports having chest pains and palpitations since Sunday morning. She reports it's been continuous since then. She reports bending down or laying down, makes the palpitations worse. She does drink caffeine, about 1 cup a day. She works in Conservation officer, nature at [employer]. She denies any increase stressors or changes in life right now. She has type 1 diabetes and is on an insulin pump. She has an Actor who is managing this for her. Her last A1c in May was 6.9. She also had TSH that was done and this was noted to be elevated at 5.35. Further chart review showed elevated TSH back in 2022 and TPO antibodies was also elevated. This has been monitored by her Endocrinologist and no treatment was ever started due to her not having symptoms. Husband was pulled back in room to add on history. He also reported fatigue.  Outpatient Encounter Medications as of 06/12/2023  Medication Sig   cetirizine (ZYRTEC) 10 MG tablet Take 10 mg by mouth daily.   Continuous Glucose Receiver (DEXCOM G6 RECEIVER) DEVI Use   Continuous Glucose Sensor (DEXCOM G7 SENSOR) MISC Use 1 Device every 10 (ten) days   Insulin Glargine Solostar (LANTUS) 100 UNIT/ML Solostar Pen Inject 18 Units into the skin daily.   insulin lispro (HUMALOG KWIKPEN) 100 UNIT/ML KwikPen Inject up to 100 units daily into the skin   insulin lispro (HUMALOG) 100 UNIT/ML  injection Inject up to 100 units daily into the pump   Insulin Pen Needle 32G X 4 MM MISC Use twice daily as directed   levonorgestrel (MIRENA) 20 MCG/24HR IUD 1 each by Intrauterine route once.   Melatonin 10 MG TABS Take 10 mg by mouth at bedtime as needed (sleep).   Omega-3 Fatty Acids (FISH OIL) 1000 MG CAPS Take 1,000 mg by mouth daily.   [DISCONTINUED] gabapentin (NEURONTIN) 300 MG capsule Take 1 capsule by mouth 3 (three) times daily.   [DISCONTINUED] atovaquone-proguanil (MALARONE) 250-100 MG TABS tablet Take 1 tablet by mouth daily. Begin 2 days prearrival, take daily during stay, and continue for 7 days posttravel for malaria prevention   [DISCONTINUED] azithromycin (ZITHROMAX) 500 MG tablet For NONBLOODY diarrhea, take 2 pills on day 1. If resolved, stop medication. If diarrhea persists, take 1 pill on day 2 and 3. For BLOODY diarrhea, take 2 pills on day 1 and 1 pill on day 2 and 3.   [DISCONTINUED] gabapentin (NEURONTIN) 300 MG capsule Take 600 mg by mouth at bedtime.   [DISCONTINUED] gabapentin (NEURONTIN) 300 MG capsule Take 1 capsule by mouth three times daily   [DISCONTINUED] HUMALOG 100 UNIT/ML injection Inject 100 Units into the skin continuous. Via pump, 100 units per day   [DISCONTINUED] insulin lispro (HUMALOG) 100 UNIT/ML cartridge Inject up to 100 units daily in pump   [DISCONTINUED]  methylPREDNISolone (MEDROL DOSEPAK) 4 MG TBPK tablet 6 day dose pack - take as directed   No facility-administered encounter medications on file as of 06/12/2023.    Past Medical History:  Diagnosis Date   Diabetes mellitus without complication (HCC)    Ovarian cyst    PTSD (post-traumatic stress disorder)    Septate uterus     Past Surgical History:  Procedure Laterality Date   FINGER FRACTURE SURGERY Left    pinkie   FULGURATION OF LESION N/A 05/13/2017   Procedure: FULGURATION OF ENDOMETRIOSIS;  Surgeon: Herold Harms, MD;  Location: [facility] ORS;  Service: Gynecology;   Laterality: N/A;   LAPAROSCOPIC OVARIAN CYSTECTOMY Left 05/13/2017   Procedure: LAPAROSCOPIC LEFT OVARIAN CYSTECTOMY WITH PERITONEAL BIOPSIE;  Surgeon: Herold Harms, MD;  Location: [facility] ORS;  Service: Gynecology;  Laterality: Left;   NO PAST SURGERIES      Family History  Problem Relation Age of Onset   Diabetes Mother    Healthy Father    Heart disease Maternal Grandmother    Breast cancer Neg Hx    Ovarian cancer Neg Hx    Colon cancer Neg Hx     Social History   Socioeconomic History   Marital status: Married    Spouse name: Not on file   Number of children: Not on file   Years of education: Not on file   Highest education level: Not on file  Occupational History   Not on file  Tobacco Use   Smoking status: Never    Passive exposure: Never   Smokeless tobacco: Never  Vaping Use   Vaping status: Never Used  Substance and Sexual Activity   Alcohol use: Yes    Comment: occ   Drug use: Never   Sexual activity: Yes    Birth control/protection: I.U.D.  Other Topics Concern   Not on file  Social History Narrative   Not on file   Social Determinants of Health   Financial Resource Strain: Not on file  Food Insecurity: Not on file  Transportation Needs: Not on file  Physical Activity: Not on file  Stress: Not on file  Social Connections: Not on file  Intimate Partner Violence: Not on file    Review of Systems  Constitutional:  Positive for malaise/fatigue.  Cardiovascular:  Positive for chest pain and palpitations.  All other systems reviewed and are negative.      Objective    BP 117/71   Pulse 96   Temp 98.2 F (36.8 C) (Oral)   Resp 18   Ht 5\' 10"  (1.778 m)   Wt 148 lb 1.6 oz (67.2 kg)   SpO2 99%   BMI 21.25 kg/m   Physical Exam Vitals and nursing note reviewed.  Constitutional:      Appearance: Normal appearance. She is normal weight.  HENT:     Head: Normocephalic and atraumatic.     Right Ear: External ear normal.     Left Ear:  External ear normal.     Nose: Nose normal.     Mouth/Throat:     Mouth: Mucous membranes are moist.     Pharynx: Oropharynx is clear.  Eyes:     Conjunctiva/sclera: Conjunctivae normal.     Pupils: Pupils are equal, round, and reactive to light.  Cardiovascular:     Rate and Rhythm: Normal rate and regular rhythm.     Pulses: Normal pulses.     Heart sounds: Normal heart sounds.  Pulmonary:  Effort: Pulmonary effort is normal.     Breath sounds: Normal breath sounds.  Skin:    General: Skin is warm.     Capillary Refill: Capillary refill takes less than 2 seconds.  Neurological:     General: No focal deficit present.     Mental Status: She is alert and oriented to person, place, and time. Mental status is at baseline.  Psychiatric:        Mood and Affect: Mood normal.        Behavior: Behavior normal.        Thought Content: Thought content normal.        Judgment: Judgment normal.       Assessment & Plan:   Problem List Items Addressed This Visit   None Abnormal thyroid blood test -     TSH -     T4, free -     Anti-TPO Ab (RDL)  Encounter to establish care with new doctor  Other chest pain -     EKG 12-Lead  Palpitations -     EKG 12-Lead -     TSH -     T4, free -     Anti-TPO Ab (RDL)  Diabetes mellitus type 1, uncomplicated, on long term insulin pump (HCC)   EKG NSR without any abnormalities Pt noted to have hx of abnormal TSH and TPO. Will recheck today and may  warrant starting treatment. I advised to pt and husband that she is also to follow up with her endocrinologist that she will be seeing next week. To continue follow up with Endo on Type 1 diabetes. Explained to them I do not manage insulin pump/type 1 diabetics. They voiced understanding.  No follow-ups on file.   Suzan Slick, MD  Total time spent with patient today 51 minutes. This includes reviewing records, evaluating the patient and coordinating care. Face-to-face time >50%.

## 2023-06-13 LAB — T4, FREE: Free T4: 1.19 ng/dL (ref 0.82–1.77)

## 2023-06-13 LAB — TSH: TSH: 3.24 u[IU]/mL (ref 0.450–4.500)

## 2023-06-19 DIAGNOSIS — E039 Hypothyroidism, unspecified: Secondary | ICD-10-CM | POA: Diagnosis not present

## 2023-06-19 DIAGNOSIS — E109 Type 1 diabetes mellitus without complications: Secondary | ICD-10-CM | POA: Diagnosis not present

## 2023-06-19 LAB — COMPREHENSIVE METABOLIC PANEL: eGFR: 123

## 2023-06-19 LAB — PROTEIN / CREATININE RATIO, URINE: Creatinine, Urine: 20.5

## 2023-06-19 LAB — MICROALBUMIN / CREATININE URINE RATIO: Microalb Creat Ratio: <34.1

## 2023-06-19 LAB — HEMOGLOBIN A1C: Hemoglobin A1C: 6.5

## 2023-06-19 LAB — MICROALBUMIN, URINE: Microalb, Ur: <7

## 2023-06-21 LAB — HM DIABETES EYE EXAM

## 2023-06-21 LAB — ANTI-TPO AB (RDL): Anti-TPO Ab (RDL): 103.5 [IU]/mL — ABNORMAL HIGH (ref <9.0)

## 2023-06-24 ENCOUNTER — Encounter: Payer: Self-pay | Admitting: Family Medicine

## 2023-07-02 ENCOUNTER — Ambulatory Visit: Payer: Commercial Managed Care - PPO

## 2023-07-03 NOTE — Progress Notes (Signed)
Arch in orthotic was too high patient states heated orthotics and dropped arch patient was happy with fit and function and will call office if any problems arise

## 2023-07-08 ENCOUNTER — Other Ambulatory Visit: Payer: Self-pay

## 2023-07-10 ENCOUNTER — Ambulatory Visit: Payer: Commercial Managed Care - PPO | Attending: Family Medicine

## 2023-07-10 ENCOUNTER — Encounter: Payer: Self-pay | Admitting: Family Medicine

## 2023-07-10 ENCOUNTER — Ambulatory Visit: Payer: Commercial Managed Care - PPO | Admitting: Family Medicine

## 2023-07-10 VITALS — BP 100/63 | HR 59 | Temp 97.9°F | Resp 18 | Ht 70.0 in | Wt 149.3 lb

## 2023-07-10 DIAGNOSIS — R002 Palpitations: Secondary | ICD-10-CM

## 2023-07-10 NOTE — Progress Notes (Signed)
Established Patient Office Visit  Subjective   Patient ID: Michelle [name], female    DOB: June 21, 1991  Age: 32 y.o. MRN: [id number]  Chief Complaint  Patient presents with   Medical Management of Chronic Issues    Patient is here for a 4 week follow up , she states that she is feeling a little better    HPI  Palpitations She still has some palpitations but not as much. Happens sporadically through the day. She also has hx of abnormal thyroid TPO studies. This has been followed by her endocrinologist who has monitored for now. She denies hx of anxiety and states she doesn't feel anxious. Not using a lot of caffeine. Today's HR is noted to be low at 59.  Review of Systems  Cardiovascular:  Positive for palpitations.  All other systems reviewed and are negative.    Objective:     BP 100/63   Pulse (!) 59   Temp 97.9 F (36.6 C) (Oral)   Resp 18   Ht 5\' 10"  (1.778 m)   Wt 149 lb 4.8 oz (67.7 kg)   SpO2 98%   BMI 21.42 kg/m  BP Readings from Last 3 Encounters:  07/10/23 100/63  06/12/23 117/71  08/18/20 132/75      Physical Exam Vitals and nursing note reviewed.  Constitutional:      Appearance: Normal appearance. She is normal weight.  HENT:     Head: Normocephalic and atraumatic.     Right Ear: External ear normal.     Left Ear: External ear normal.     Nose: Nose normal.     Mouth/Throat:     Mouth: Mucous membranes are moist.     Pharynx: Oropharynx is clear.  Eyes:     Conjunctiva/sclera: Conjunctivae normal.     Pupils: Pupils are equal, round, and reactive to light.  Cardiovascular:     Rate and Rhythm: Regular rhythm. Bradycardia present.     Pulses: Normal pulses.     Heart sounds: Normal heart sounds.  Pulmonary:     Effort: Pulmonary effort is normal.     Breath sounds: Normal breath sounds.  Skin:    General: Skin is warm.     Capillary Refill: Capillary refill takes less than 2 seconds.  Neurological:     General: No focal deficit present.      Mental Status: She is alert and oriented to person, place, and time. Mental status is at baseline.  Psychiatric:        Mood and Affect: Mood normal.        Behavior: Behavior normal.        Thought Content: Thought content normal.        Judgment: Judgment normal.    No results found for any visits on 07/10/23.  Last thyroid functions Lab Results  Component Value Date   TSH 3.240 06/12/2023      The ASCVD Risk score (Arnett DK, et al., 2019) failed to calculate for the following reasons:   The 2019 ASCVD risk score is only valid for ages 57 to 27    Assessment & Plan:   Problem List Items Addressed This Visit   None Palpitations -     LONG TERM MONITOR (3-14 DAYS); Future   Pt reports palpitations. Noted to have low HR today. Concerned for brady-tachy syndrome? Suggest pt wearing a heart monitor to capture these episodes. Pt is in agreement to plan. Referral for ziopatch placed.   No follow-ups  on file.    Suzan Slick, MD

## 2023-07-11 ENCOUNTER — Other Ambulatory Visit (HOSPITAL_COMMUNITY): Payer: Self-pay

## 2023-07-11 NOTE — Progress Notes (Unsigned)
EP to read

## 2023-07-19 DIAGNOSIS — E109 Type 1 diabetes mellitus without complications: Secondary | ICD-10-CM | POA: Diagnosis not present

## 2023-07-19 DIAGNOSIS — Z794 Long term (current) use of insulin: Secondary | ICD-10-CM | POA: Diagnosis not present

## 2023-07-19 DIAGNOSIS — E108 Type 1 diabetes mellitus with unspecified complications: Secondary | ICD-10-CM | POA: Diagnosis not present

## 2023-07-25 ENCOUNTER — Encounter: Payer: Self-pay | Admitting: Family Medicine

## 2023-07-27 DIAGNOSIS — R002 Palpitations: Secondary | ICD-10-CM

## 2023-08-09 DIAGNOSIS — R002 Palpitations: Secondary | ICD-10-CM | POA: Diagnosis not present

## 2023-08-14 ENCOUNTER — Encounter: Payer: Self-pay | Admitting: Family Medicine

## 2023-08-14 DIAGNOSIS — R002 Palpitations: Secondary | ICD-10-CM

## 2023-09-09 ENCOUNTER — Ambulatory Visit: Payer: Commercial Managed Care - PPO | Admitting: Dermatology

## 2023-09-09 ENCOUNTER — Encounter: Payer: Self-pay | Admitting: Dermatology

## 2023-09-09 VITALS — BP 121/78

## 2023-09-09 DIAGNOSIS — D1801 Hemangioma of skin and subcutaneous tissue: Secondary | ICD-10-CM

## 2023-09-09 DIAGNOSIS — D2271 Melanocytic nevi of right lower limb, including hip: Secondary | ICD-10-CM

## 2023-09-09 DIAGNOSIS — D492 Neoplasm of unspecified behavior of bone, soft tissue, and skin: Secondary | ICD-10-CM

## 2023-09-09 DIAGNOSIS — Z808 Family history of malignant neoplasm of other organs or systems: Secondary | ICD-10-CM

## 2023-09-09 DIAGNOSIS — Z1283 Encounter for screening for malignant neoplasm of skin: Secondary | ICD-10-CM | POA: Diagnosis not present

## 2023-09-09 DIAGNOSIS — L578 Other skin changes due to chronic exposure to nonionizing radiation: Secondary | ICD-10-CM

## 2023-09-09 DIAGNOSIS — W908XXA Exposure to other nonionizing radiation, initial encounter: Secondary | ICD-10-CM

## 2023-09-09 DIAGNOSIS — D225 Melanocytic nevi of trunk: Secondary | ICD-10-CM

## 2023-09-09 DIAGNOSIS — D229 Melanocytic nevi, unspecified: Secondary | ICD-10-CM

## 2023-09-09 DIAGNOSIS — D485 Neoplasm of uncertain behavior of skin: Secondary | ICD-10-CM

## 2023-09-09 DIAGNOSIS — L814 Other melanin hyperpigmentation: Secondary | ICD-10-CM

## 2023-09-09 DIAGNOSIS — L821 Other seborrheic keratosis: Secondary | ICD-10-CM

## 2023-09-09 NOTE — Progress Notes (Unsigned)
   New Patient Visit   Subjective  Michelle [name] is a 33 y.o. female who presents for the following: New Pt - Moles - TBSE  Patient states she has moles located at the back that she would like to have examined. Patient reports the areas have been there for her entire lifetime. She reports the areas are bothersome.Patient rates irritation (itching)7 out of 10. She states some has increased in size. Patient reports she has previously been treated for these areas. Patient denies Hx of bx. Patient reprots family history of skin cancer(s).(Father & paternal grandmother - unsure of type)  The patient has spots, moles and lesions to be evaluated, some may be new or changing and the patient may have concern these could be cancer.   The following portions of the chart were reviewed this encounter and updated as appropriate: medications, allergies, medical history  Review of Systems:  No other skin or systemic complaints except as noted in HPI or Assessment and Plan.  Objective   Well appearing patient in no apparent distress; mood and affect are within normal limits.   A focused examination was performed of the following areas: scattered   Relevant exam findings are noted in the Assessment and Plan.            Assessment & Plan   HEMANGIOMAS - Benign normal skin lesions - Benign-appearing - Call for any changes  BENIGN MELANOCYTIC NEVI - Tan-brown and/or pink-flesh-colored symmetric macules and papules - Benign appearing on exam today - Observation - Call clinic for new or changing moles - Recommend daily use of broad spectrum spf 30+ sunscreen to sun-exposed areas.   MILD ACTINIC DAMAGE - Chronic condition, secondary to cumulative UV/sun exposure - diffuse scaly erythematous macules with underlying dyspigmentation - Recommend daily broad spectrum sunscreen SPF 30+ to sun-exposed areas, reapply every 2 hours as needed.  - Staying in the shade or wearing long sleeves, sun glasses  (UVA+UVB protection) and wide brim hats (4-inch brim around the entire circumference of the hat) are also recommended for sun protection.  - Call for new or changing lesions.   SKIN CANCER SCREENING PERFORMED TODAY   No follow-ups on file.    Documentation: I have reviewed the above documentation for accuracy and completeness, and I agree with the above.   I, Michelle [name], CMA, am acting as scribe for [employer], DO.   Langston Reusing, DO

## 2023-09-09 NOTE — Patient Instructions (Addendum)

## 2023-09-11 ENCOUNTER — Encounter: Payer: Self-pay | Admitting: Family Medicine

## 2023-09-11 ENCOUNTER — Ambulatory Visit (INDEPENDENT_AMBULATORY_CARE_PROVIDER_SITE_OTHER): Payer: Commercial Managed Care - PPO | Admitting: Family Medicine

## 2023-09-11 VITALS — BP 111/67 | HR 70 | Temp 98.0°F | Resp 18 | Ht 70.0 in | Wt 148.9 lb

## 2023-09-11 DIAGNOSIS — Z9641 Presence of insulin pump (external) (internal): Secondary | ICD-10-CM | POA: Diagnosis not present

## 2023-09-11 DIAGNOSIS — E109 Type 1 diabetes mellitus without complications: Secondary | ICD-10-CM

## 2023-09-11 DIAGNOSIS — Z23 Encounter for immunization: Secondary | ICD-10-CM

## 2023-09-11 DIAGNOSIS — Z0000 Encounter for general adult medical examination without abnormal findings: Secondary | ICD-10-CM

## 2023-09-11 LAB — SURGICAL PATHOLOGY

## 2023-09-11 NOTE — Progress Notes (Signed)
Complete physical exam  Patient: Michelle [name]   DOB: 1991/01/22   32 y.o. Female  MRN: [id number]  Subjective:    Chief Complaint  Patient presents with   Annual Exam    Patient is here for her annual physical. Patient is fasting this morning    Michelle [name] is a 33 y.o. female who presents today for a complete physical exam. She reports consuming a general diet.  Running and strength 3-5 days a week  She generally feels well. She reports sleeping fairly well. She does not have additional problems to discuss today.    Most recent fall risk assessment:    06/12/2023    9:55 AM  Fall Risk   Falls in the past year? 0  Number falls in past yr: 0  Risk for fall due to : No Fall Risks  Follow up Falls evaluation completed     Most recent depression screenings:    06/12/2023    9:56 AM  PHQ 2/9 Scores  PHQ - 2 Score 0  PHQ- 9 Score 2        Patient Care Team: Suzan Slick, MD as PCP - General (Family Medicine)   Outpatient Medications Prior to Visit  Medication Sig   cetirizine (ZYRTEC) 10 MG tablet Take 10 mg by mouth daily.   Continuous Glucose Receiver (DEXCOM G6 RECEIVER) DEVI Use   Continuous Glucose Sensor (DEXCOM G7 SENSOR) MISC Use 1 Device every 10 (ten) days   Insulin Glargine Solostar (LANTUS) 100 UNIT/ML Solostar Pen Inject 18 Units into the skin daily.   insulin lispro (HUMALOG KWIKPEN) 100 UNIT/ML KwikPen Inject up to 100 units daily into the skin   insulin lispro (HUMALOG) 100 UNIT/ML injection Inject up to 100 units daily into the pump   Insulin Pen Needle 32G X 4 MM MISC Use twice daily as directed   levonorgestrel (MIRENA) 20 MCG/24HR IUD 1 each by Intrauterine route once.   Melatonin 10 MG TABS Take 10 mg by mouth at bedtime as needed (sleep).   Omega-3 Fatty Acids (FISH OIL) 1000 MG CAPS Take 1,000 mg by mouth daily.   No facility-administered medications prior to visit.    ROS        Objective:     BP 111/67   Pulse 70   Temp 98  F (36.7 C) (Oral)   Resp 18   Ht 5\' 10"  (1.778 m)   Wt 148 lb 14.4 oz (67.5 kg)   SpO2 99%   BMI 21.36 kg/m    Physical Exam   Results for orders placed or performed in visit on 09/11/23  Microalbumin, urine  Result Value Ref Range   Microalb, Ur <7   Microalbumin / creatinine urine ratio  Result Value Ref Range   Microalb Creat Ratio <34.1   Protein / creatinine ratio, urine  Result Value Ref Range   Creatinine, Urine 20.5   Comprehensive metabolic panel  Result Value Ref Range   eGFR 123   Hemoglobin A1c  Result Value Ref Range   Hemoglobin A1C 6.5   HM HIV SCREENING LAB  Result Value Ref Range   HM HIV Screening Negative - Validated   HM HEPATITIS C SCREENING LAB  Result Value Ref Range   HM Hepatitis Screen Negative-Validated   HM DIABETES FOOT EXAM  Result Value Ref Range   HM Diabetic Foot Exam podiatry completed        Assessment & Plan:    Routine Health Maintenance  and Physical Exam  Immunization History  Administered Date(s) Administered   Influenza, Seasonal, Injecte, Preservative Fre 07/02/2013   Influenza,inj,Quad PF,6+ Mos 05/07/2019   Influenza-Unspecified 06/30/2012, 04/08/2023   Pneumococcal Polysaccharide-23 03/14/2012   Tdap 02/26/2011   Varicella 06/28/2015    Health Maintenance  Topic Date Due   COVID-19 Vaccine (1) Never done   OPHTHALMOLOGY EXAM  Never done   Pneumococcal Vaccine 6-73 Years old (2 of 2 - PCV) 03/14/2013   DTaP/Tdap/Td (2 - Td or Tdap) 02/25/2021   HEMOGLOBIN A1C  12/17/2023   FOOT EXAM  04/01/2024   Diabetic kidney evaluation - eGFR measurement  06/18/2024   Diabetic kidney evaluation - Urine ACR  06/18/2024   Cervical Cancer Screening (HPV/Pap Cotest)  02/03/2027   INFLUENZA VACCINE  Completed   Hepatitis C Screening  Completed   HIV Screening  Completed   HPV VACCINES  Aged Out    Discussed health benefits of physical activity, and encouraged her to engage in regular exercise appropriate for her age and  condition.  Problem List Items Addressed This Visit   None  No follow-ups on file.     Suzan Slick, MD

## 2023-09-12 ENCOUNTER — Encounter: Payer: Self-pay | Admitting: Dermatology

## 2023-09-12 ENCOUNTER — Encounter: Payer: Self-pay | Admitting: Family Medicine

## 2023-09-12 LAB — CBC WITH DIFFERENTIAL/PLATELET
Basophils Absolute: 0 10*3/uL (ref 0.0–0.2)
Basos: 0 %
EOS (ABSOLUTE): 0.1 10*3/uL (ref 0.0–0.4)
Eos: 2 %
Hematocrit: 44.6 % (ref 34.0–46.6)
Hemoglobin: 14.5 g/dL (ref 11.1–15.9)
Immature Grans (Abs): 0 10*3/uL (ref 0.0–0.1)
Immature Granulocytes: 0 %
Lymphocytes Absolute: 0.9 10*3/uL (ref 0.7–3.1)
Lymphs: 15 %
MCH: 29.4 pg (ref 26.6–33.0)
MCHC: 32.5 g/dL (ref 31.5–35.7)
MCV: 91 fL (ref 79–97)
Monocytes Absolute: 0.5 10*3/uL (ref 0.1–0.9)
Monocytes: 8 %
Neutrophils Absolute: 4.2 10*3/uL (ref 1.4–7.0)
Neutrophils: 75 %
Platelets: 184 10*3/uL (ref 150–450)
RBC: 4.93 x10E6/uL (ref 3.77–5.28)
RDW: 12.2 % (ref 11.7–15.4)
WBC: 5.7 10*3/uL (ref 3.4–10.8)

## 2023-09-16 MED ORDER — MUPIROCIN 2 % EX OINT: 1.0000 | TOPICAL_OINTMENT | Freq: Two times a day (BID) | CUTANEOUS | 0 refills | Status: AC

## 2023-09-16 MED ORDER — DOXYCYCLINE HYCLATE 100 MG PO TABS: 100.0000 mg | ORAL_TABLET | Freq: Two times a day (BID) | ORAL | 0 refills | Status: AC

## 2023-09-16 NOTE — Telephone Encounter (Signed)
 Please send non visit order for mupirocin ointment to be applied BID for 2 weeks and oral doxy 100mg  to be taken BID for 7 days with food. Please call pt and let her know these were sent in.  Thanks!

## 2023-09-17 ENCOUNTER — Encounter: Payer: Self-pay | Admitting: Dermatology

## 2023-09-17 NOTE — Progress Notes (Signed)
 Hi Michelle [name],  Please call Michelle [name] and notify that their bx results showed 2 abnormal moles that requires a full excision in office with Dr Caralyn Guile  FINAL DIAGNOSIS and MICROSCOPIC DESCRIPTION Diagnosis 1. Skin , left breast --> SE w/ Dr Demetrius Charity DYSPLASTIC COMPOUND NEVUS WITH MODERATE ATYPIA, CLOSE TO MARGIN  2. Skin , right lower leg - posterior --> --> SE w/ Dr Demetrius Charity DYSPLASTIC NEVUS WITH MODERATE TO SEVERE ATYPIA, CLOSE TO MARGIN, SEE DESCRIPTION

## 2023-09-18 ENCOUNTER — Telehealth: Payer: Self-pay

## 2023-09-18 DIAGNOSIS — D239 Other benign neoplasm of skin, unspecified: Secondary | ICD-10-CM

## 2023-09-18 HISTORY — DX: Other benign neoplasm of skin, unspecified: D23.9

## 2023-09-18 NOTE — Telephone Encounter (Signed)
-----   Message from Langston Reusing sent at 09/17/2023  4:58 PM EST ----- Hi Michelle [name],  Please call pt and notify that their bx results showed 2 abnormal moles that requires a full excision in office with Dr Caralyn Guile  FINAL DIAGNOSIS and MICROSCOPIC DESCRIPTION Diagnosis 1. Skin , left breast --> SE w/ Dr Demetrius Charity DYSPLASTIC COMPOUND NEVUS WITH MODERATE ATYPIA, CLOSE TO MARGIN  2. Skin , right lower leg - posterior --> --> SE w/ Dr Demetrius Charity DYSPLASTIC NEVUS WITH MODERATE TO SEVERE ATYPIA, CLOSE TO MARGIN, SEE DESCRIPTION

## 2023-09-18 NOTE — Telephone Encounter (Signed)
 Scheduled pt for 2 Ex:  10/02/23 @ 11.15am - L breast   10/16/23 @ 11.15am - R lower leg posterior

## 2023-10-01 ENCOUNTER — Encounter: Payer: Self-pay | Admitting: Dermatology

## 2023-10-02 ENCOUNTER — Encounter: Payer: Self-pay | Admitting: Dermatology

## 2023-10-02 ENCOUNTER — Ambulatory Visit: Payer: Commercial Managed Care - PPO | Admitting: Dermatology

## 2023-10-02 VITALS — BP 111/67 | HR 66

## 2023-10-02 DIAGNOSIS — D235 Other benign neoplasm of skin of trunk: Secondary | ICD-10-CM

## 2023-10-02 DIAGNOSIS — L988 Other specified disorders of the skin and subcutaneous tissue: Secondary | ICD-10-CM | POA: Diagnosis not present

## 2023-10-02 DIAGNOSIS — D239 Other benign neoplasm of skin, unspecified: Secondary | ICD-10-CM

## 2023-10-02 NOTE — Patient Instructions (Signed)

## 2023-10-02 NOTE — Progress Notes (Signed)
   Follow-Up Visit   Subjective  Michelle [name] is a 33 y.o. 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 Onalee Hua.  The following portions of the chart were reviewed this encounter and updated as appropriate: medications, allergies, medical history  Review of Systems:  No other skin or systemic complaints except as noted in HPI or Assessment and Plan.  Objective  Well appearing patient in no apparent distress; mood and affect are within normal limits.  A focused examination was performed of the following areas: Left breast Relevant physical exam findings are noted in the Assessment and Plan.   Left Breast Healing biopsy site    Assessment & Plan   DYSPLASTIC NEVUS Left Breast Skin excision - Left Breast  Excision method:  elliptical Lesion length (cm):  0.7 Lesion width (cm):  0.8 Margin per side (cm):  0.3 Total excision diameter (cm):  1.4 Informed consent: discussed and consent obtained   Timeout: patient name, date of birth, surgical site, and procedure verified   Procedure prep:  Patient was prepped and draped in usual sterile fashion Prep type:  Isopropyl alcohol Anesthesia: the lesion was anesthetized in a standard fashion   Anesthetic:  1% lidocaine w/ epinephrine 1-100,000 buffered w/ 8.4% NaHCO3 Instrument used: #15 blade   Hemostasis achieved with: suture, pressure and electrodesiccation   Hemostasis achieved with comment:  5.0 monocryl with dermabond and steri strips Outcome: patient tolerated procedure well with no complications   Post-procedure details: sterile dressing applied and wound care instructions given   Additional details:  Final length 3.8  Skin repair - Left Breast Complexity:  Complex Final length (cm):  3.8 Informed consent: discussed and consent obtained   Timeout: patient name, date of birth, surgical site, and procedure verified   Procedure prep:  Patient was prepped and draped in  usual sterile fashion Prep type:  Isopropyl alcohol Anesthesia: the lesion was anesthetized in a standard fashion   Anesthetic:  1% lidocaine w/ epinephrine 1-100,000 buffered w/ 8.4% NaHCO3 Reason for type of repair: reduce tension to allow closure, preserve normal anatomical and functional relationships, avoid adjacent structures and allow side-to-side closure without requiring a flap or graft   Undermining: area extensively undermined   Subcutaneous layers (deep stitches):  Suture size:  5-0 Suture type: Monocryl (poliglecaprone 25)   Stitches:  Buried vertical mattress Fine/surface layer approximation (top stitches):  Suture type: cyanoacrylate tissue glue   Hemostasis achieved with: suture, pressure and electrodesiccation Outcome: patient tolerated procedure well with no complications   Post-procedure details: sterile dressing applied and wound care instructions given   Dressing type: pressure dressing and bandage   Specimen 1 - Surgical pathology Differential Diagnosis: DN [id number] Check Margins: No   Return in about 2 years (around 10/01/2025) for for DN severe on leg.  I, Tillie Fantasia, CMA, am acting as scribe for Gwenith Daily, MD.   Documentation: I have reviewed the above documentation for accuracy and completeness, and I agree with the above.  Gwenith Daily, MD

## 2023-10-03 LAB — SURGICAL PATHOLOGY

## 2023-10-08 DIAGNOSIS — Z794 Long term (current) use of insulin: Secondary | ICD-10-CM | POA: Diagnosis not present

## 2023-10-08 DIAGNOSIS — E108 Type 1 diabetes mellitus with unspecified complications: Secondary | ICD-10-CM | POA: Diagnosis not present

## 2023-10-08 DIAGNOSIS — E109 Type 1 diabetes mellitus without complications: Secondary | ICD-10-CM | POA: Diagnosis not present

## 2023-10-12 ENCOUNTER — Other Ambulatory Visit (HOSPITAL_COMMUNITY): Payer: Self-pay

## 2023-10-14 ENCOUNTER — Other Ambulatory Visit (HOSPITAL_COMMUNITY): Payer: Self-pay

## 2023-10-15 ENCOUNTER — Encounter: Payer: Self-pay | Admitting: Dermatology

## 2023-10-16 ENCOUNTER — Encounter: Payer: Self-pay | Admitting: Dermatology

## 2023-10-16 ENCOUNTER — Ambulatory Visit: Payer: Commercial Managed Care - PPO | Admitting: Dermatology

## 2023-10-16 VITALS — BP 113/70 | HR 62

## 2023-10-16 DIAGNOSIS — L905 Scar conditions and fibrosis of skin: Secondary | ICD-10-CM

## 2023-10-16 DIAGNOSIS — D2371 Other benign neoplasm of skin of right lower limb, including hip: Secondary | ICD-10-CM

## 2023-10-16 DIAGNOSIS — D2271 Melanocytic nevi of right lower limb, including hip: Secondary | ICD-10-CM | POA: Diagnosis not present

## 2023-10-16 DIAGNOSIS — Z86018 Personal history of other benign neoplasm: Secondary | ICD-10-CM

## 2023-10-16 DIAGNOSIS — D239 Other benign neoplasm of skin, unspecified: Secondary | ICD-10-CM

## 2023-10-16 NOTE — Progress Notes (Signed)
 Follow-Up Visit   Subjective  Michelle [name] is a 33 y.o. female who presents for the following: Excision of a DN moderate to severe, on the right lower leg-posterior, referred by Dr. Onalee Hua.   She is s/p WLE for DN moderate on the left chest and healing well, repaired with linear closure.  The following portions of the chart were reviewed this encounter and updated as appropriate: medications, allergies, medical history  Review of Systems:  No other skin or systemic complaints except as noted in HPI or Assessment and Plan.  Objective  Well appearing patient in no apparent distress; mood and affect are within normal limits.  A focused examination was performed of the following areas: Right lower leg-posterior Relevant physical exam findings are noted in the Assessment and Plan.     Assessment & Plan   DYSPLASTIC NEVUS Right Lower Leg - Posterior Skin excision - Right Lower Leg - Posterior  Excision method:  elliptical Lesion length (cm):  1.3 Lesion width (cm):  1 Margin per side (cm):  0.5 Total excision diameter (cm):  2.3 Informed consent: discussed and consent obtained   Timeout: patient name, date of birth, surgical site, and procedure verified   Procedure prep:  Patient was prepped and draped in usual sterile fashion Prep type:  Isopropyl alcohol Anesthesia: the lesion was anesthetized in a standard fashion   Anesthetic:  1% lidocaine w/ epinephrine 1-100,000 buffered w/ 8.4% NaHCO3 Instrument used: #15 blade   Hemostasis achieved with: suture, pressure and electrodesiccation   Hemostasis achieved with comment:  3.0 PDS with dermabond and steri strips Outcome: patient tolerated procedure well with no complications   Post-procedure details: sterile dressing applied and wound care instructions given   Dressing type: bandage and pressure dressing   Additional details:  Final length 6.0  Skin repair - Right Lower Leg - Posterior Complexity:  Complex Final length (cm):   6 Informed consent: discussed and consent obtained   Timeout: patient name, date of birth, surgical site, and procedure verified   Procedure prep:  Patient was prepped and draped in usual sterile fashion Prep type:  Chlorhexidine Anesthesia: the lesion was anesthetized in a standard fashion   Anesthetic:  1% lidocaine w/ epinephrine 1-100,000 buffered w/ 8.4% NaHCO3 Reason for type of repair: reduce tension to allow closure, avoid adjacent structures, allow side-to-side closure without requiring a flap or graft and compensate for the inelasticity of skin in this area   Undermining: area extensively undermined   Subcutaneous layers (deep stitches):  Suture size:  3-0 Suture type: PDS (polydioxanone)   Stitches:  Buried vertical mattress Fine/surface layer approximation (top stitches):  Suture type: cyanoacrylate tissue glue   Hemostasis achieved with: suture, pressure and electrodesiccation Outcome: patient tolerated procedure well with no complications   Post-procedure details: sterile dressing applied and wound care instructions given   Dressing type: bandage and pressure dressing   Specimen 1 - Surgical pathology Differential Diagnosis: DN [id number] Check Margins: No  The surgical wound was then cleaned, prepped, and re-anesthetized as above. Wound edges were undermined extensively along at least one entire edge and at a distance equal to or greater than the width of the defect (see wound defect size above) in order to achieve closure and decrease wound tension and anatomic distortion. Redundant tissue repair including standing cone removal was performed. Hemostasis was achieved with electrocautery. Subcutaneous and epidermal tissues were approximated with the above sutures. The surgical site was then lightly scrubbed with sterile, saline-soaked gauze. Steri-strips were  applied, and the area was then bandaged using Vaseline ointment, non-adherent gauze, gauze pads, and tape to provide  an adequate pressure dressing. The patient tolerated the procedure well, was given detailed written and verbal wound care instructions, and was discharged in good condition.   The patient will follow-up: PRN.  Scar s/p WLE for DN moderate on the left chest, treated on 10/02/2023, repaired with linear closure - Reassured that wound has healed well - Discussed that scars take up to 12 months to mature from the date of surgery - Recommend SPF 30+ to scar daily to prevent purple color - OK to start scar massage at 4-6 weeks post-op - Can consider silicone based products for scar healing  No follow-ups on file.   Documentation: I have reviewed the above documentation for accuracy and completeness, and I agree with the above.  Gwenith Daily, MD

## 2023-10-16 NOTE — Patient Instructions (Signed)

## 2023-10-17 ENCOUNTER — Other Ambulatory Visit: Payer: Self-pay

## 2023-10-17 ENCOUNTER — Other Ambulatory Visit (HOSPITAL_COMMUNITY): Payer: Self-pay

## 2023-10-17 LAB — SURGICAL PATHOLOGY

## 2023-10-17 MED ORDER — DEXCOM G7 SENSOR MISC
11 refills | Status: AC
  Filled 2023-10-17: qty 9, 90d supply, fill #0
  Filled 2024-01-10: qty 9, 90d supply, fill #1
  Filled 2024-04-09: qty 9, 90d supply, fill #2
  Filled 2024-07-06: qty 9, 90d supply, fill #3

## 2023-10-22 ENCOUNTER — Ambulatory Visit: Payer: Commercial Managed Care - PPO | Attending: Cardiology | Admitting: Cardiology

## 2023-10-22 VITALS — BP 96/58 | HR 62 | Ht 70.0 in | Wt 147.0 lb

## 2023-10-22 DIAGNOSIS — R002 Palpitations: Secondary | ICD-10-CM

## 2023-10-22 DIAGNOSIS — R079 Chest pain, unspecified: Secondary | ICD-10-CM

## 2023-10-22 NOTE — Progress Notes (Signed)
 Cardiology Office Note:  .   Date:  10/27/2023  ID:  Erroll Luna, DOB Sep 27, 1990, MRN [id number] PCP: Suzan Slick, MD  [facility] Providers Cardiologist:  Bryan Lemma, MD     Chief Complaint  Patient presents with   New Patient (Initial Visit)    Consult for palpitations and chest pain    Patient Profile: .     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    Erroll Luna was seen on July 10, 2023 by Dr. Wyline Mood to evaluate palpitations.  She noted at this time symptoms are not as significant as they have been in the past but still happening sporadically throughout the day.  Denied use of caffeine.  Did not not necessarily feel any more anxious than usual. => Zio patch ordered  Subjective  Discussed the use of AI scribe software for clinical note transcription with the patient, who gave verbal consent to proceed.  History of Present Illness History of Present Illness MAECY PODGURSKI is a 33 year old female with type 1 diabetes who presents with chest pain and irregular heartbeat.  She has been experiencing chest pain since November, described as a 'stabbing' sensation that occurs at rest and is not typically associated with exertion. The pain is localized to the chest and does not worsen with deep breathing. It can occur while she is sitting at home and does not have any specific triggers.  She experiences palpitations approximately once a week, lasting about thirty minutes each time. These episodes are accompanied by dizziness, but she has not experienced syncope. The palpitations feel like her heart is 'skipping a beat.' A monitor did not show any concerning findings, although she did experience one episode while wearing it.  In November, she experienced shortness of breath when lying down, which  lasted for about a month, but she no longer has this symptom. No recent fevers, chills, or illnesses. There is no swelling in her legs, and she does not wake up at night due to breathing difficulties.  Her current medications include insulin for type 1 diabetes, melatonin for sleep, fish oil, Zyrtec for allergies, and Dipyrone. 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    Objective   Medications reviewed-she is on Zyrtec 10 mg daily, Lantus 18 mg daily and Humalog meal coverage.  She also has a Mirena IUD placed.  She uses melatonin as needed for sleep  Social History   Tobacco Use   Smoking status: Never    Passive exposure: Never   Smokeless tobacco: Never  Vaping Use   Vaping status: Never Used  Substance Use Topics   Alcohol use: Yes    Comment: occ   Drug use: Never   Family History  Problem Relation Age of Onset   Diabetes Mother    Healthy Father    Heart disease Maternal Grandmother    Breast cancer Neg Hx    Ovarian cancer Neg Hx    Colon cancer Neg Hx     Studies Reviewed: Marland Kitchen   EKG Interpretation Date/Time:  Tuesday October 22 2023 14:41:07 EDT Ventricular Rate:  62 PR Interval:  138 QRS Duration:  82 QT Interval:  394 QTC Calculation: 399 R Axis:   54  Text Interpretation: Normal sinus rhythm Normal ECG When  compared with ECG of 12-Jun-2023 11:12, No significant change was found Confirmed by Bryan Lemma ([id number]) on 10/22/2023 2:57:09 PM    12-day Zio Patch (December 2024): Predominant sinus rhythm with a rate range 47 148 bpm.  Average 79 bpm.  1 PAT episode of 6 beats.  No A-fib.  Rare ectopy both PACs and PVCs.  No sustained arrhythmias.  Symptoms noted with sinus rhythm.  Risk Assessment/Calculations:           Physical Exam:   VS:  BP (!) 96/58 (BP Location: Left Arm, Patient Position: Sitting, Cuff Size: Normal)   Pulse 62   Ht 5\' 10"  (1.778 m)   Wt 147 lb (66.7 kg)   SpO2 98%   BMI 21.09 kg/m    Wt Readings from Last 3  Encounters:  10/22/23 147 lb (66.7 kg)  09/11/23 148 lb 14.4 oz (67.5 kg)  07/10/23 149 lb 4.8 oz (67.7 kg)    GEN: Well nourished, well developed in no acute distress; healthy-appearing.  Well-nourished and well-groomed. NECK: No JVD; No carotid bruits CARDIAC: Normal S1, S2; RRR, no murmurs, rubs, gallops RESPIRATORY:  Clear to auscultation without rales, wheezing or rhonchi ; nonlabored, good air movement. ABDOMEN: Soft, non-tender, non-distended EXTREMITIES:  No edema; No deformity      ASSESSMENT AND PLAN: .    Problem List Items Addressed This Visit     Chest pain of uncertain etiology - Primary (Chronic)   Intermittent stabbing chest pain, occurring at rest and not associated with exertion. Pain is located in an unusual area, likely musculoskeletal in origin, possibly related to the pectoralis minor muscle. No signs of cardiac origin as pain is not worsened by exertion. The pain is not consistent with cardiac ischemia, which would typically worsen with exertion and improve with rest.   - Reassure that chest pain is likely musculoskeletal and benign   - Advise to monitor symptoms and report if she worsen or change        Relevant Orders   EKG 12-Lead (Completed)   Heart palpitations (Chronic)   Palpitations since November, occurring approximately once a week, lasting about 30 minutes, and associated with dizziness.  Holter monitor showed rare PACs and PVCs with only 1 short atrial run.  No significant arrhythmias.   Symptoms are not exacerbated by exertion and are likely benign.  Potential triggers such as caffeine, stress, and dehydration were discussed.   - Advise on avoiding potential triggers such as caffeine, stress, and dehydration   - Educate on maneuvers to break palpitations if she occurs, such as vigorous coughing, blowing through a straw, or drinking ice-cold water   - Reassure that current findings are benign and do not require further testing   - Instruct to  report if palpitations become more frequent or sustained             Follow-Up: Return if symptoms worsen or fail to improve, for Followup when necessary.      Signed, Marykay Lex, MD, MS Bryan Lemma, M.D., M.S. Interventional Cardiologist  [facility]  Pager # [phone] Phone # [phone] [address] [place], [place] [address]

## 2023-10-22 NOTE — Patient Instructions (Signed)
 Medication Instructions:  No changes    Lab Work:  .   Testing/Procedures: Not needed   Follow-Up: At [facility], you and your health needs are our priority.  As part of our continuing mission to provide you with exceptional heart care, we have created designated Provider Care Teams.  These Care Teams include your primary Cardiologist (physician) and Advanced Practice Providers (APPs -  Physician Assistants and Nurse Practitioners) who all work together to provide you with the care you need, when you need it.     Your next appointment:   As needed   The format for your next appointment:   In Person  Provider:   Bryan Lemma, MD    Other Instructions  Keep hydrated  May use liberal salt intake  Recommendations for vagal maneuvers: "Bearing down" Coughing Gagging Icy, cold towel on face or drink ice cold water

## 2023-10-27 ENCOUNTER — Encounter: Payer: Self-pay | Admitting: Cardiology

## 2023-10-27 DIAGNOSIS — R002 Palpitations: Secondary | ICD-10-CM | POA: Insufficient documentation

## 2023-10-27 DIAGNOSIS — R079 Chest pain, unspecified: Secondary | ICD-10-CM | POA: Insufficient documentation

## 2023-10-27 NOTE — Assessment & Plan Note (Signed)
 Intermittent stabbing chest pain, occurring at rest and not associated with exertion. Pain is located in an unusual area, likely musculoskeletal in origin, possibly related to the pectoralis minor muscle. No signs of cardiac origin as pain is not worsened by exertion. The pain is not consistent with cardiac ischemia, which would typically worsen with exertion and improve with rest.   - Reassure that chest pain is likely musculoskeletal and benign   - Advise to monitor symptoms and report if she worsen or change

## 2023-10-27 NOTE — Assessment & Plan Note (Signed)
 Palpitations since November, occurring approximately once a week, lasting about 30 minutes, and associated with dizziness.  Holter monitor showed rare PACs and PVCs with only 1 short atrial run.  No significant arrhythmias.   Symptoms are not exacerbated by exertion and are likely benign.  Potential triggers such as caffeine, stress, and dehydration were discussed.   - Advise on avoiding potential triggers such as caffeine, stress, and dehydration   - Educate on maneuvers to break palpitations if she occurs, such as vigorous coughing, blowing through a straw, or drinking ice-cold water   - Reassure that current findings are benign and do not require further testing   - Instruct to report if palpitations become more frequent or sustained

## 2024-01-01 ENCOUNTER — Other Ambulatory Visit (HOSPITAL_COMMUNITY): Payer: Self-pay

## 2024-01-01 DIAGNOSIS — E039 Hypothyroidism, unspecified: Secondary | ICD-10-CM | POA: Diagnosis not present

## 2024-01-01 DIAGNOSIS — E109 Type 1 diabetes mellitus without complications: Secondary | ICD-10-CM | POA: Diagnosis not present

## 2024-01-10 ENCOUNTER — Other Ambulatory Visit: Payer: Self-pay

## 2024-01-10 ENCOUNTER — Other Ambulatory Visit (HOSPITAL_COMMUNITY): Payer: Self-pay

## 2024-01-17 DIAGNOSIS — E108 Type 1 diabetes mellitus with unspecified complications: Secondary | ICD-10-CM | POA: Diagnosis not present

## 2024-01-17 DIAGNOSIS — E109 Type 1 diabetes mellitus without complications: Secondary | ICD-10-CM | POA: Diagnosis not present

## 2024-01-17 DIAGNOSIS — Z794 Long term (current) use of insulin: Secondary | ICD-10-CM | POA: Diagnosis not present

## 2024-02-14 DIAGNOSIS — R300 Dysuria: Secondary | ICD-10-CM | POA: Diagnosis not present

## 2024-02-14 DIAGNOSIS — R102 Pelvic and perineal pain: Secondary | ICD-10-CM | POA: Diagnosis not present

## 2024-02-14 DIAGNOSIS — G8929 Other chronic pain: Secondary | ICD-10-CM | POA: Diagnosis not present

## 2024-02-14 DIAGNOSIS — N809 Endometriosis, unspecified: Secondary | ICD-10-CM | POA: Diagnosis not present

## 2024-02-14 DIAGNOSIS — M545 Low back pain, unspecified: Secondary | ICD-10-CM | POA: Diagnosis not present

## 2024-03-03 DIAGNOSIS — E109 Type 1 diabetes mellitus without complications: Secondary | ICD-10-CM | POA: Diagnosis not present

## 2024-03-03 DIAGNOSIS — E039 Hypothyroidism, unspecified: Secondary | ICD-10-CM | POA: Diagnosis not present

## 2024-03-10 ENCOUNTER — Telehealth: Admitting: Physician Assistant

## 2024-03-10 DIAGNOSIS — R3989 Other symptoms and signs involving the genitourinary system: Secondary | ICD-10-CM | POA: Diagnosis not present

## 2024-03-10 MED ORDER — CEPHALEXIN 500 MG PO CAPS: 500.0000 mg | ORAL_CAPSULE | Freq: Two times a day (BID) | ORAL | 0 refills | Status: AC

## 2024-03-10 NOTE — Progress Notes (Signed)
 Message sent to patient requesting further input regarding current symptoms. Awaiting patient response.

## 2024-03-10 NOTE — Progress Notes (Signed)
 I have spent 5 minutes in review of e-visit questionnaire, review and updating patient chart, medical decision making and response to patient.   Elsie Velma Lunger, PA-C

## 2024-03-10 NOTE — Progress Notes (Signed)

## 2024-03-24 ENCOUNTER — Other Ambulatory Visit (HOSPITAL_COMMUNITY): Payer: Self-pay

## 2024-03-25 ENCOUNTER — Other Ambulatory Visit (HOSPITAL_COMMUNITY): Payer: Self-pay

## 2024-03-25 ENCOUNTER — Other Ambulatory Visit: Payer: Self-pay

## 2024-03-25 MED ORDER — INSULIN LISPRO 100 UNIT/ML IJ SOLN
100.0000 [IU] | Freq: Every day | INTRAMUSCULAR | 4 refills | Status: AC
  Filled 2024-03-25: qty 90, 90d supply, fill #0
  Filled 2024-07-06: qty 90, 90d supply, fill #1

## 2024-04-07 DIAGNOSIS — Z794 Long term (current) use of insulin: Secondary | ICD-10-CM | POA: Diagnosis not present

## 2024-04-07 DIAGNOSIS — E109 Type 1 diabetes mellitus without complications: Secondary | ICD-10-CM | POA: Diagnosis not present

## 2024-04-07 DIAGNOSIS — E108 Type 1 diabetes mellitus with unspecified complications: Secondary | ICD-10-CM | POA: Diagnosis not present

## 2024-04-09 ENCOUNTER — Other Ambulatory Visit (HOSPITAL_COMMUNITY): Payer: Self-pay

## 2024-07-07 ENCOUNTER — Other Ambulatory Visit: Payer: Self-pay

## 2024-07-07 ENCOUNTER — Other Ambulatory Visit (HOSPITAL_COMMUNITY): Payer: Self-pay

## 2024-07-26 ENCOUNTER — Other Ambulatory Visit (HOSPITAL_COMMUNITY): Payer: Self-pay

## 2024-07-27 ENCOUNTER — Other Ambulatory Visit: Payer: Self-pay

## 2024-07-27 ENCOUNTER — Other Ambulatory Visit (HOSPITAL_COMMUNITY): Payer: Self-pay

## 2024-07-27 MED ORDER — LANTUS SOLOSTAR 100 UNIT/ML ~~LOC~~ SOPN
18.0000 [IU] | PEN_INJECTOR | Freq: Every day | SUBCUTANEOUS | 1 refills | Status: AC
  Filled 2024-07-27 – 2024-07-31 (×2): qty 15, 83d supply, fill #0

## 2024-07-29 ENCOUNTER — Other Ambulatory Visit (HOSPITAL_COMMUNITY): Payer: Self-pay

## 2024-07-30 ENCOUNTER — Other Ambulatory Visit: Payer: Self-pay

## 2024-07-30 ENCOUNTER — Encounter: Payer: Self-pay | Admitting: Pharmacist

## 2024-07-30 NOTE — Progress Notes (Signed)
 " OUTPATIENT PHYSICAL THERAPY FEMALE PELVIC EVALUATION   Patient Name: Michelle [name] MRN: [id number] DOB:07/22/1991, 34 y.o., female Today's Date: 07/31/2024  END OF SESSION:  PT End of Session - 07/31/24 0807     Visit Number 1    Date for Recertification  01/28/25    Authorization Type cone    PT Start Time 0800    PT Stop Time 0840    PT Time Calculation (min) 40 min    Activity Tolerance Patient tolerated treatment well    Behavior During Therapy [facility] for tasks assessed/performed          Past Medical History:  Diagnosis Date   Diabetes mellitus without complication (HCC)    Dysplastic nevus 09/18/2023   L breast - Moderate atypia - Ex needed R lower leg posterior - Severe atypia - Ex needed    Ovarian cyst    PTSD (post-traumatic stress disorder)    Septate uterus    Past Surgical History:  Procedure Laterality Date   FINGER FRACTURE SURGERY Left    pinkie   FULGURATION OF LESION N/A 05/13/2017   Procedure: FULGURATION OF ENDOMETRIOSIS;  Surgeon: Kathe Gladis LABOR, MD;  Location: [facility] ORS;  Service: Gynecology;  Laterality: N/A;   LAPAROSCOPIC OVARIAN CYSTECTOMY Left 05/13/2017   Procedure: LAPAROSCOPIC LEFT OVARIAN CYSTECTOMY WITH PERITONEAL BIOPSIE;  Surgeon: Kathe Gladis LABOR, MD;  Location: [facility] ORS;  Service: Gynecology;  Laterality: Left;   NO PAST SURGERIES     Patient Active Problem List   Diagnosis Date Noted   Chest pain of uncertain etiology 10/27/2023   Heart palpitations 10/27/2023   Dysplastic nevus 09/18/2023   Pelvic pain in female 06/12/2023   Serous cystadenoma of left ovary 05/23/2017   Status post laparoscopy 05/13/2017   Endometriosis determined by laparoscopy 05/13/2017   Anxiety 02/08/2017   Insulin  pump status 02/08/2017   Left ovarian cyst 02/08/2017   Septate uterus 02/08/2017   Dysmenorrhea 02/08/2017    PCP: Colette Torrence GRADE, MD  REFERRING PROVIDER: Verdon Keen, MD   REFERRING DIAG: R10.20,G89.29 (ICD-10-CM) -  Chronic pelvic pain in female   THERAPY DIAG:  Other lack of coordination  Cramp and spasm  Pelvic pain  Rationale for Evaluation and Treatment: Rehabilitation  ONSET DATE: 2009  SUBJECTIVE:                                                                                                                                                                                           SUBJECTIVE STATEMENT: Provide information on TENS units for pain management. Patient started pain when a teenager. Patient had an ovarian cyst. Pain was worse  in 2018 and diagnosed with laparoscopic surgery. Patient still having chronic pain.  Fluid intake: water, coffee, milk, juice   PERTINENT HISTORY:  Medications for current condition: IUD Surgeries: Laparoscopic endometriosis fulguration (04/2017)endometriosis; Laparoscopic ovarian cystectomy; and Pelvic laparoscopy.   Other: Diabetes; PTSD; Septate uterus; IUD Sexual abuse: No  PAIN:  Are you having pain? Yes NPRS scale: 10/10 Pain location: lower abdominal area  Pain type: sharp and stabbing Pain description: intermittent stabbing pain but continues to have a lingering pain  Aggravating factors: cycle, bowel movements Relieving factors: 1 week out of a month she feels good and after her period  PRECAUTIONS: None  RED FLAGS: None   WEIGHT BEARING RESTRICTIONS: No  FALLS:  Has patient fallen in last 6 months? No  OCCUPATION: scientist  ACTIVITY LEVEL : run,  floor exercise  PLOF: Independent  PATIENT GOALS: exercises for pain, pain management   BOWEL MOVEMENT: Pain with bowel movement: Yes, 10/10 Type of bowel movement:Type (Bristol Stool Scale) Type 4, Frequency daily, and Strain no Fully empty rectum: Yes:   Leakage: No                                                  Bowel urgency: no Fiber supplement/laxative No  URINATION: Pain with urination: No Fully empty bladder: Yes:                                            Post-void dribble: No Stream: Strong Urgency: No Frequency:during the day every 2-3 hours                                                        Nocturia: Yes:  2 times   Leakage: none Pads/briefs: No  INTERCOURSE:  Ability to have vaginal penetration Yes  Pain with intercourse: Initial Penetration, During Penetration, Deep Penetration, After Intercourse, and Pain Interrupts Intercourse Dryness: Yes  Climax: not with penetration, only with clitoral Marinoff Scale: 2/3 Lubricant:yes  PREGNANCY:none   PROLAPSE: None   OBJECTIVE:  Note: Objective measures were completed at Evaluation unless otherwise noted.   PATIENT SURVEYS:  Female Sexual Function Index (FSFI) Questionnaire 22.1  FSFI Score = 22.1   The individual domain scores and the overall FSFI score are described below:  Domain Items Factor Score Desire 1, 2 0.6 4.2 Arousal 3, 4, 5, 6 0.3 3.9 Lubrication 7, 8, 9, 10 0.3 4.8 Orgasm 11, 12, 13 0.4 3.6 Satisfaction 14, 15, 16 0.4 4.4 Pain 17, 18, 19 0.4 1.2 FSFI score - - 22.1 COGNITION: Overall cognitive status: Within functional limits for tasks assessed     SENSATION: Light touch: Appears intact   FUNCTIONAL TESTS:  Single leg stance:  Rt:no hip drop  Lt:no hip drop Sit-up test: Squat: able squat, single leg squat knee goes slightly inward Bed mobility:good  GAIT: Assistive device utilized: None  POSTURE: No Significant postural limitations   LUMBARAROM/PROM:full lumbar ROM   LOWER EXTREMITY ROM: full bilateral hip ROM  LOWER EXTREMITY MMT:  MMT Right eval Left eval  Hip abduction 5/5 4/5   (  Blank rows = not tested) PALPATION:  Pelvic Alignment: left ilium is rotated posteriorly  Abdominal:  Diastasis: No Distortion: No  Breathing: able to open up the lower rib cage and fell the abdomen Scar tissue: No               External Perineal Exam: Negative Q-tip test; perineal body is white and white patch on the left labia majora  inferiorly                             Internal Pelvic Floor: tenderness located on bilateral levator ani and obturator internist with left >/ right  Patient confirms identification and approves PT to assess internal pelvic floor and treatment Yes All internal or external pelvic floor assessments and/or treatments are completed with proper hand hygiene and gloves hands. If needed gloves are changed with hand hygiene during patient care time. No emotional/communication barriers or cognitive limitation. Patient is motivated to learn. Patient understands and agrees with treatment goals and plan. PT explains patient will be examined in standing, sitting, and lying down to see how their muscles and joints work. When they are ready, they will be asked to remove their underwear so PT can examine their perineum. The patient is also given the option of providing their own chaperone as one is not provided in our facility. The patient also has the right and is explained the right to defer or refuse any part of the evaluation or treatment including the internal exam. With the patient's consent, PT will use one gloved finger to gently assess the muscles of the pelvic floor, seeing how well it contracts and relaxes and if there is muscle symmetry. After, the patient will get dressed and PT and patient will discuss exam findings and plan of care. PT and patient discuss plan of care, schedule, attendance policy and HEP activities.   PELVIC MMT:   MMT eval  Vaginal 3/5 with less of hug on left and hold 2 sec  (Blank rows = not tested)        TONE: Increased on the left  PROLAPSE: None  TODAY'S TREATMENT:                                                                                                                              DATE: 07/31/24  EVAL Examination completed, findings reviewed, pt educated on POC, HEP, and female pelvic floor anatomy, reasoning with pelvic floor assessment internally with pt consent,.  Pt motivated to participate in PT and agreeable to attempt recommendations.     PATIENT EDUCATION:  Education details: Access Code: [email or web address] Person educated: Patient Education method: Explanation, Demonstration, Tactile cues, Verbal cues, and Handouts Education comprehension: verbalized understanding, returned demonstration, verbal cues required, tactile cues required, and needs further education  HOME EXERCISE PROGRAM: 07/31/24 Access Code: [email or web address] URL: [email or web address] Date: 07/31/2024 Prepared by: Channing Pereyra  Program Notes vaginal wand at  [email or web address] to massage pelvic floor muscles  Exercises - Supine Diaphragmatic Breathing  - 1 x daily - 7 x weekly - 1 sets - 10 reps - Seated Diaphragmatic Breathing  - 1 x daily - 7 x weekly - 1 sets - 10 reps  ASSESSMENT:  CLINICAL IMPRESSION: Patient is a 34 y.o. female who was seen today for physical therapy evaluation and treatment for pelvic pain. Patient has had chronic pelvic pain since she was 17. She has been diagnosed with endometriosis with laparoscopic surgery. Patient reports her stabbing pain is 10/10 that comes on suddenly and with bowel movements. She has 1 week out of the month after her cycle without pain.  She has pain with penile penetration vaginally and at times it will interfere with completion. Negative Q-tip test. Perineal body is white with tissue changes and some white patch on the left lower labia majora. She has pain on bilateral levator ani and obturator internist with left worse than right. Her left ilium is rotated posteriorly. She will bring her knee inward with single leg squat. Patient will benefit from skilled therapy to learn how to manage her pain and reduce her trigger points for pain management.   OBJECTIVE IMPAIRMENTS: decreased activity tolerance, increased fascial restrictions, increased muscle spasms, and pain.   ACTIVITY LIMITATIONS: lifting, squatting, and  toileting  PARTICIPATION LIMITATIONS: interpersonal relationship and community activity  PERSONAL FACTORS: Time since onset of injury/illness/exacerbation are also affecting patient's functional outcome.   REHAB POTENTIAL: Excellent  CLINICAL DECISION MAKING: Evolving/moderate complexity  EVALUATION COMPLEXITY: Moderate   GOALS: Goals reviewed with patient? Yes  SHORT TERM GOALS: Target date: 08/28/24  Patient independent with initial HEP for diaphragmatic breathing and hip stretches.  Baseline: Goal status: INITIAL  2.  Patient educated on the vaginal wand to use to reduce trigger points in the pelvic floor.  Baseline:  Goal status: INITIAL  3.  Patient educated patient on the use of a home tens unit for pain management as per MD notes.  Baseline:  Goal status: INITIAL  4.  Patient educated on how to have a bowel movement with diaphragmatic breathing for less pain.  Baseline:  Goal status: INITIAL   LONG TERM GOALS: Target date: 01/28/25  Patient independent with advanced HEP for pelvic floor relaxation and SI joint mobilization.  Baseline:  Goal status: INITIAL  2.  Patient is able to have a bowel movement with pain decreased >/= 2-3/10 due to relaxation of the pelvic floor with toileting.  Baseline:  Goal status: INITIAL  3.  Patient is able to have penile penetration with pain level </= 3/10 due to reduction of trigger points in the pelvic floor and relaxation of the pelvic floor.  Baseline:  Goal status: INITIAL  4.  Patient is independent with using the vaginal wand to  her pelvic floor muscles to manage her pelvic pain.  Baseline:  Goal status: INITIAL   PLAN:  PT FREQUENCY: 1-2x/week  PT DURATION: 6 months  PLANNED INTERVENTIONS: 97110-Therapeutic exercises, 97530- Therapeutic activity, 97112- Neuromuscular re-education, 97535- Self Care, 02859- Manual therapy, G0283- Electrical stimulation (unattended), 20560 (1-2 muscles), 20561 (3+ muscles)- Dry  Needling, Patient/Family education, Joint mobilization, Spinal mobilization, Cryotherapy, Moist heat, and Biofeedback  PLAN FOR NEXT SESSION: correct pelvis, hip stretches, manual work externally to the pelvic floor   Channing Pereyra, PT 07/31/2024 10:11 AM   "

## 2024-07-31 ENCOUNTER — Encounter: Payer: Self-pay | Admitting: Physical Therapy

## 2024-07-31 ENCOUNTER — Other Ambulatory Visit: Payer: Self-pay

## 2024-07-31 ENCOUNTER — Ambulatory Visit: Attending: Obstetrics and Gynecology | Admitting: Physical Therapy

## 2024-07-31 ENCOUNTER — Other Ambulatory Visit (HOSPITAL_COMMUNITY): Payer: Self-pay

## 2024-07-31 DIAGNOSIS — R102 Pelvic and perineal pain unspecified side: Secondary | ICD-10-CM | POA: Insufficient documentation

## 2024-07-31 DIAGNOSIS — R278 Other lack of coordination: Secondary | ICD-10-CM | POA: Diagnosis not present

## 2024-07-31 DIAGNOSIS — G8929 Other chronic pain: Secondary | ICD-10-CM | POA: Insufficient documentation

## 2024-07-31 DIAGNOSIS — N809 Endometriosis, unspecified: Secondary | ICD-10-CM | POA: Diagnosis not present

## 2024-07-31 DIAGNOSIS — R252 Cramp and spasm: Secondary | ICD-10-CM | POA: Diagnosis not present

## 2024-08-19 ENCOUNTER — Telehealth: Admitting: Emergency Medicine

## 2024-08-19 DIAGNOSIS — J111 Influenza due to unidentified influenza virus with other respiratory manifestations: Secondary | ICD-10-CM | POA: Diagnosis not present

## 2024-08-19 DIAGNOSIS — B349 Viral infection, unspecified: Secondary | ICD-10-CM

## 2024-08-19 MED ORDER — OSELTAMIVIR PHOSPHATE 75 MG PO CAPS: 75.0000 mg | ORAL_CAPSULE | Freq: Two times a day (BID) | ORAL | 0 refills | Status: AC

## 2024-08-19 NOTE — Patient Instructions (Signed)
 " Michelle [name], thank you for joining Jon CHRISTELLA Belt, NP for today's virtual visit.  While this provider is not your primary care provider (PCP), if your PCP is located in our provider database this encounter information will be shared with them immediately following your visit.   A [facility] MyChart account gives you access to today's visit and all your visits, tests, and labs performed at [facility]  click here if you don't have a [name] MyChart account or go to [email or web address]  Consent: (Patient) Michelle [name] provided verbal consent for this virtual visit at the beginning of the encounter.  Current Medications:  Current Outpatient Medications:    oseltamivir  (TAMIFLU ) 75 MG capsule, Take 1 capsule (75 mg total) by mouth 2 (two) times daily., Disp: 10 capsule, Rfl: 0   cetirizine (ZYRTEC) 10 MG tablet, Take 10 mg by mouth daily., Disp: , Rfl:    Continuous Glucose Receiver (DEXCOM G6 RECEIVER) DEVI, Use, Disp: , Rfl:    Continuous Glucose Sensor (DEXCOM G7 SENSOR) MISC, Use 1 sensor every 10 (ten) days., Disp: 3 each, Rfl: 11   insulin  glargine (LANTUS  SOLOSTAR) 100 UNIT/ML Solostar Pen, Inject 18 Units into the skin daily., Disp: 15 mL, Rfl: 1   insulin  lispro (HUMALOG  KWIKPEN) 100 UNIT/ML KwikPen, Inject up to 100 units daily into the skin, Disp: 90 mL, Rfl: 1   insulin  lispro (HUMALOG ) 100 UNIT/ML injection, Inject 100 Units total into the skin daily in the pump., Disp: 90 mL, Rfl: 4   Insulin  Pen Needle 32G X 4 MM MISC, Use twice daily as directed, Disp: 200 each, Rfl: 4   levonorgestrel  (MIRENA ) 20 MCG/24HR IUD, 1 each by Intrauterine route once., Disp: , Rfl:    Melatonin 10 MG TABS, Take 10 mg by mouth at bedtime as needed (sleep)., Disp: , Rfl:    mupirocin  ointment (BACTROBAN ) 2 %, Apply 1 Application topically 2 (two) times daily. Use for 2 weeks., Disp: 22 g, Rfl: 0   Omega-3 Fatty Acids (FISH OIL) 1000 MG CAPS, Take 1,000 mg by mouth daily., Disp: ,  Rfl:    Medications ordered in this encounter:  Meds ordered this encounter  Medications   oseltamivir  (TAMIFLU ) 75 MG capsule    Sig: Take 1 capsule (75 mg total) by mouth 2 (two) times daily.    Dispense:  10 capsule    Refill:  0     *If you need refills on other medications prior to your next appointment, please contact your pharmacy*  Follow-Up: Call back or seek an in-person evaluation if the symptoms worsen or if the condition fails to improve as anticipated.  [facility] [phone]  Other Instructions  I recommend taking Mucinex (ok to use generic guaifenesin) and using saline nasal spray several times a day to help your congestion thin out and drain.   If you have been instructed to have an in-person evaluation today at a local Urgent Care facility, please use the link below. It will take you to a list of all of our available [facility], including address, phone number and hours of operation. Please do not delay care.  [facility]  If you or a family member do not have a primary care provider, use the link below to schedule a visit and establish care. When you choose a [facility] primary care physician or advanced practice provider, you gain a long-term partner in health. Find a Primary Care Provider  Learn more about [facility]'s in-office and virtual care options: [facility] - Get Care Now  "

## 2024-08-19 NOTE — Progress Notes (Signed)
 " Virtual Visit Consent   Michelle [name], you are scheduled for a virtual visit with a [facility] provider today. Just as with appointments in the office, your consent must be obtained to participate. Your consent will be active for this visit and any virtual visit you may have with one of our providers in the next 365 days. If you have a MyChart account, a copy of this consent can be sent to you electronically.  As this is a virtual visit, video technology does not allow for your provider to perform a traditional examination. This may limit your provider's ability to fully assess your condition. If your provider identifies any concerns that need to be evaluated in person or the need to arrange testing (such as labs, EKG, etc.), we will make arrangements to do so. Although advances in technology are sophisticated, we cannot ensure that it will always work on either your end or our end. If the connection with a video visit is poor, the visit may have to be switched to a telephone visit. With either a video or telephone visit, we are not always able to ensure that we have a secure connection.  By engaging in this virtual visit, you consent to the provision of healthcare and authorize for your insurance to be billed (if applicable) for the services provided during this visit. Depending on your insurance coverage, you may receive a charge related to this service.  I need to obtain your verbal consent now. Are you willing to proceed with your visit today? Michelle [name] has provided verbal consent on 08/19/2024 for a virtual visit (video or telephone). Jon CHRISTELLA Belt, NP  Date: 08/19/2024 12:16 PM   Virtual Visit via Video Note   I, Jon CHRISTELLA Belt, connected with  Michelle [name]  ([id number], 1991-01-20) on 08/19/24 at 12:15 PM EST by a video-enabled telemedicine application and verified that I am speaking with the correct person using two identifiers.  Location: Patient: Virtual Visit Location Patient:  Home Provider: Virtual Visit Location Provider: Home Office   I discussed the limitations of evaluation and management by telemedicine and the availability of in person appointments. The patient expressed understanding and agreed to proceed.    History of Present Illness: Michelle [name] is a 34 y.o. who identifies as a female who was assigned female at birth, and is being seen today for Flu?   Sore throat, chest congestion, runny nose, fever, body aches, headache. Sx started 08/17/24. Taking dayquil, doesn't help much. Recent flu exposure (she was on a cruise and everyone on the cruise posted to facebook they came home with the flu)  Is monitoring her glucose carefully and adjusting her insulin  as needed. Feels she is able to manage this while she is sick.   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  Resting comfortably  at home.  Head is normocephalic, atraumatic.  No labored breathing.  Speech is clear and  coherent with logical content.  Patient is alert and oriented at baseline.    Assessment and Plan: 1. Viral infection (Primary) - oseltamivir  (TAMIFLU ) 75 MG capsule; Take 1 capsule (75 mg total) by mouth 2 (two) times daily.  Dispense: 10 capsule; Refill: 0  2. Influenza-like illness  Tamiflu  is flagging as contraindcated due to zoloft allergy. She will check with pharmacist about  this prior to taking it.   Continue supportive treatments for sx at home. Given work note.   Follow Up Instructions: I discussed the assessment and treatment plan with the patient. The patient was provided an opportunity to ask questions and all were answered. The patient agreed with the plan and demonstrated an understanding of the instructions.  A copy of instructions were sent to the patient via MyChart unless otherwise noted below.   The patient was advised to call back or seek an in-person evaluation if the symptoms worsen or if the condition fails to improve as anticipated.    Jon CHRISTELLA Belt, NP    [1]  Allergies Allergen Reactions   Zoloft [Sertraline Hcl] Itching and Palpitations  [2]  Current Outpatient Medications:    oseltamivir  (TAMIFLU ) 75 MG capsule, Take 1 capsule (75 mg total) by mouth 2 (two) times daily., Disp: 10 capsule, Rfl: 0   cetirizine (ZYRTEC) 10 MG tablet, Take 10 mg by mouth daily., Disp: , Rfl:    Continuous Glucose Receiver (DEXCOM G6 RECEIVER) DEVI, Use, Disp: , Rfl:    Continuous Glucose Sensor (DEXCOM G7 SENSOR) MISC, Use 1 sensor every 10 (ten) days., Disp: 3 each, Rfl: 11   insulin  glargine (LANTUS  SOLOSTAR) 100 UNIT/ML Solostar Pen, Inject 18 Units into the skin daily., Disp: 15 mL, Rfl: 1   insulin  lispro (HUMALOG  KWIKPEN) 100 UNIT/ML KwikPen, Inject up to 100 units daily into the skin, Disp: 90 mL, Rfl: 1   insulin  lispro (HUMALOG ) 100 UNIT/ML injection, Inject 100 Units total into the skin daily in the pump., Disp: 90 mL, Rfl: 4   Insulin  Pen Needle 32G X 4 MM MISC, Use twice daily as directed, Disp: 200 each, Rfl: 4   levonorgestrel  (MIRENA ) 20 MCG/24HR IUD, 1 each by Intrauterine route once., Disp: , Rfl:    Melatonin 10 MG TABS, Take 10 mg by mouth at bedtime as needed (sleep)., Disp: , Rfl:    mupirocin  ointment (BACTROBAN ) 2 %, Apply 1 Application topically 2 (two) times daily. Use for 2 weeks., Disp: 22 g, Rfl: 0   Omega-3 Fatty Acids (FISH  OIL) 1000 MG CAPS, Take 1,000 mg by mouth daily., Disp: , Rfl:   "

## 2024-09-04 ENCOUNTER — Ambulatory Visit: Admitting: Physical Therapy

## 2024-09-11 ENCOUNTER — Ambulatory Visit: Admitting: Physical Therapy

## 2024-09-18 ENCOUNTER — Ambulatory Visit: Admitting: Physical Therapy

## 2024-09-25 ENCOUNTER — Ambulatory Visit: Admitting: Physical Therapy

## 2024-10-09 ENCOUNTER — Encounter: Admitting: Family Medicine
